# Patient Record
Sex: Female | Born: 1945 | Race: Black or African American | Hispanic: No | Smoking: Never smoker
Health system: Southern US, Community
[De-identification: ages and names within clinical notes are randomized; demographics above are authoritative.]

## PROBLEM LIST (undated history)

## (undated) DIAGNOSIS — G5600 Carpal tunnel syndrome, unspecified upper limb: Secondary | ICD-10-CM

## (undated) DIAGNOSIS — M1990 Unspecified osteoarthritis, unspecified site: Secondary | ICD-10-CM

## (undated) DIAGNOSIS — D649 Anemia, unspecified: Secondary | ICD-10-CM

## (undated) DIAGNOSIS — E119 Type 2 diabetes mellitus without complications: Secondary | ICD-10-CM

## (undated) DIAGNOSIS — E785 Hyperlipidemia, unspecified: Secondary | ICD-10-CM

## (undated) DIAGNOSIS — E11359 Type 2 diabetes mellitus with proliferative diabetic retinopathy without macular edema: Secondary | ICD-10-CM

## (undated) DIAGNOSIS — F418 Other specified anxiety disorders: Secondary | ICD-10-CM

## (undated) DIAGNOSIS — I2510 Atherosclerotic heart disease of native coronary artery without angina pectoris: Secondary | ICD-10-CM

## (undated) DIAGNOSIS — I10 Essential (primary) hypertension: Secondary | ICD-10-CM

## (undated) DIAGNOSIS — I5032 Chronic diastolic (congestive) heart failure: Secondary | ICD-10-CM

## (undated) HISTORY — PX: ABDOMINAL HYSTERECTOMY: SHX81

## (undated) HISTORY — DX: Anemia, unspecified: D64.9

## (undated) HISTORY — DX: Chronic diastolic (congestive) heart failure: I50.32

## (undated) HISTORY — DX: Unspecified osteoarthritis, unspecified site: M19.90

## (undated) HISTORY — DX: Essential (primary) hypertension: I10

## (undated) HISTORY — DX: Type 2 diabetes mellitus with proliferative diabetic retinopathy without macular edema: E11.359

## (undated) HISTORY — DX: Atherosclerotic heart disease of native coronary artery without angina pectoris: I25.10

## (undated) HISTORY — DX: Carpal tunnel syndrome, unspecified upper limb: G56.00

## (undated) HISTORY — DX: Other specified anxiety disorders: F41.8

## (undated) HISTORY — DX: Type 2 diabetes mellitus without complications: E11.9

## (undated) HISTORY — DX: Hyperlipidemia, unspecified: E78.5

---

## 1997-10-17 ENCOUNTER — Other Ambulatory Visit: Admission: RE | Admit: 1997-10-17 | Discharge: 1997-10-17 | Payer: Self-pay | Admitting: Internal Medicine

## 1997-11-02 ENCOUNTER — Other Ambulatory Visit: Admission: RE | Admit: 1997-11-02 | Discharge: 1997-11-02 | Payer: Self-pay | Admitting: Internal Medicine

## 1997-12-21 ENCOUNTER — Emergency Department (HOSPITAL_COMMUNITY): Admission: EM | Admit: 1997-12-21 | Discharge: 1997-12-21 | Payer: Self-pay | Admitting: Emergency Medicine

## 1998-08-18 ENCOUNTER — Emergency Department (HOSPITAL_COMMUNITY): Admission: EM | Admit: 1998-08-18 | Discharge: 1998-08-19 | Payer: Self-pay | Admitting: Emergency Medicine

## 2001-03-19 ENCOUNTER — Other Ambulatory Visit: Admission: RE | Admit: 2001-03-19 | Discharge: 2001-03-19 | Payer: Self-pay | Admitting: Internal Medicine

## 2001-03-20 ENCOUNTER — Encounter: Payer: Self-pay | Admitting: Internal Medicine

## 2001-03-20 ENCOUNTER — Encounter: Admission: RE | Admit: 2001-03-20 | Discharge: 2001-03-20 | Payer: Self-pay | Admitting: Internal Medicine

## 2001-05-29 ENCOUNTER — Encounter: Payer: Self-pay | Admitting: Internal Medicine

## 2001-05-29 ENCOUNTER — Encounter: Admission: RE | Admit: 2001-05-29 | Discharge: 2001-05-29 | Payer: Self-pay | Admitting: Internal Medicine

## 2003-07-05 ENCOUNTER — Ambulatory Visit (HOSPITAL_COMMUNITY): Admission: RE | Admit: 2003-07-05 | Discharge: 2003-07-05 | Payer: Self-pay | Admitting: Orthopaedic Surgery

## 2003-07-05 ENCOUNTER — Ambulatory Visit (HOSPITAL_BASED_OUTPATIENT_CLINIC_OR_DEPARTMENT_OTHER): Admission: RE | Admit: 2003-07-05 | Discharge: 2003-07-05 | Payer: Self-pay | Admitting: Orthopaedic Surgery

## 2005-07-14 ENCOUNTER — Emergency Department (HOSPITAL_COMMUNITY): Admission: EM | Admit: 2005-07-14 | Discharge: 2005-07-14 | Payer: Self-pay | Admitting: Emergency Medicine

## 2005-08-08 ENCOUNTER — Ambulatory Visit: Payer: Self-pay | Admitting: Family Medicine

## 2005-10-03 ENCOUNTER — Ambulatory Visit: Payer: Self-pay | Admitting: Family Medicine

## 2005-10-28 ENCOUNTER — Encounter: Admission: RE | Admit: 2005-10-28 | Discharge: 2005-11-21 | Payer: Self-pay | Admitting: Family Medicine

## 2005-11-27 ENCOUNTER — Ambulatory Visit: Payer: Self-pay | Admitting: Family Medicine

## 2006-08-31 ENCOUNTER — Emergency Department (HOSPITAL_COMMUNITY): Admission: EM | Admit: 2006-08-31 | Discharge: 2006-08-31 | Payer: Self-pay | Admitting: Family Medicine

## 2006-09-26 ENCOUNTER — Emergency Department (HOSPITAL_COMMUNITY): Admission: EM | Admit: 2006-09-26 | Discharge: 2006-09-26 | Payer: Self-pay | Admitting: Family Medicine

## 2008-04-25 ENCOUNTER — Emergency Department (HOSPITAL_COMMUNITY): Admission: EM | Admit: 2008-04-25 | Discharge: 2008-04-25 | Payer: Self-pay | Admitting: Family Medicine

## 2008-10-26 DIAGNOSIS — E785 Hyperlipidemia, unspecified: Secondary | ICD-10-CM

## 2008-10-26 DIAGNOSIS — I119 Hypertensive heart disease without heart failure: Secondary | ICD-10-CM | POA: Insufficient documentation

## 2008-10-26 DIAGNOSIS — R002 Palpitations: Secondary | ICD-10-CM | POA: Insufficient documentation

## 2008-10-28 ENCOUNTER — Ambulatory Visit: Payer: Self-pay | Admitting: Cardiology

## 2008-10-28 DIAGNOSIS — R0602 Shortness of breath: Secondary | ICD-10-CM

## 2008-10-28 DIAGNOSIS — Z8679 Personal history of other diseases of the circulatory system: Secondary | ICD-10-CM | POA: Insufficient documentation

## 2008-11-07 ENCOUNTER — Telehealth: Payer: Self-pay | Admitting: Cardiology

## 2008-11-08 ENCOUNTER — Telehealth (INDEPENDENT_AMBULATORY_CARE_PROVIDER_SITE_OTHER): Payer: Self-pay | Admitting: *Deleted

## 2008-11-09 ENCOUNTER — Ambulatory Visit: Payer: Self-pay

## 2008-11-09 ENCOUNTER — Ambulatory Visit: Payer: Self-pay | Admitting: Cardiology

## 2008-11-09 ENCOUNTER — Encounter: Payer: Self-pay | Admitting: Cardiology

## 2008-11-09 DIAGNOSIS — R0989 Other specified symptoms and signs involving the circulatory and respiratory systems: Secondary | ICD-10-CM

## 2008-11-09 DIAGNOSIS — R0609 Other forms of dyspnea: Secondary | ICD-10-CM | POA: Insufficient documentation

## 2008-11-10 ENCOUNTER — Ambulatory Visit: Payer: Self-pay | Admitting: Cardiology

## 2008-11-10 DIAGNOSIS — I5032 Chronic diastolic (congestive) heart failure: Secondary | ICD-10-CM | POA: Insufficient documentation

## 2008-11-10 LAB — CONVERTED CEMR LAB
BUN: 20 mg/dL (ref 6–23)
CO2: 32 meq/L (ref 19–32)
GFR calc non Af Amer: 93.27 mL/min (ref >60)
Glucose, Bld: 289 mg/dL — ABNORMAL HIGH (ref 70–99)
HDL: 40.5 mg/dL (ref >39.00)
Sodium: 143 meq/L (ref 135–145)
Triglycerides: 84 mg/dL (ref 0.0–149.0)

## 2008-11-21 ENCOUNTER — Ambulatory Visit: Payer: Self-pay | Admitting: Cardiology

## 2008-11-22 ENCOUNTER — Telehealth: Payer: Self-pay | Admitting: Cardiology

## 2008-11-23 ENCOUNTER — Encounter (INDEPENDENT_AMBULATORY_CARE_PROVIDER_SITE_OTHER): Payer: Self-pay | Admitting: *Deleted

## 2008-11-23 ENCOUNTER — Ambulatory Visit: Payer: Self-pay | Admitting: Cardiology

## 2008-11-23 DIAGNOSIS — R9430 Abnormal result of cardiovascular function study, unspecified: Secondary | ICD-10-CM | POA: Insufficient documentation

## 2008-11-23 LAB — CONVERTED CEMR LAB
Calcium: 9.1 mg/dL (ref 8.4–10.5)
GFR calc non Af Amer: 81.4 mL/min (ref >60)
Glucose, Bld: 308 mg/dL — ABNORMAL HIGH (ref 70–99)

## 2008-11-24 ENCOUNTER — Ambulatory Visit (HOSPITAL_COMMUNITY): Admission: RE | Admit: 2008-11-24 | Discharge: 2008-11-24 | Payer: Self-pay | Admitting: Cardiology

## 2008-11-24 ENCOUNTER — Ambulatory Visit: Payer: Self-pay | Admitting: Cardiology

## 2008-12-15 ENCOUNTER — Ambulatory Visit: Payer: Self-pay | Admitting: Cardiology

## 2008-12-15 DIAGNOSIS — I2510 Atherosclerotic heart disease of native coronary artery without angina pectoris: Secondary | ICD-10-CM

## 2009-04-19 ENCOUNTER — Encounter (INDEPENDENT_AMBULATORY_CARE_PROVIDER_SITE_OTHER): Payer: Self-pay | Admitting: *Deleted

## 2009-04-25 ENCOUNTER — Telehealth: Payer: Self-pay | Admitting: Cardiology

## 2009-05-31 ENCOUNTER — Ambulatory Visit: Payer: Self-pay | Admitting: Cardiology

## 2009-06-06 LAB — CONVERTED CEMR LAB
ALT: 22 units/L (ref 0–35)
AST: 18 units/L (ref 0–37)
Albumin: 3.5 g/dL (ref 3.5–5.2)
Alkaline Phosphatase: 90 units/L (ref 39–117)
Cholesterol: 154 mg/dL (ref 0–200)
Total Protein: 7.3 g/dL (ref 6.0–8.3)

## 2009-06-16 ENCOUNTER — Telehealth: Payer: Self-pay | Admitting: Cardiology

## 2009-06-27 ENCOUNTER — Telehealth: Payer: Self-pay | Admitting: Cardiology

## 2009-06-27 ENCOUNTER — Ambulatory Visit: Payer: Self-pay | Admitting: Cardiology

## 2009-06-27 LAB — CONVERTED CEMR LAB
BUN: 23 mg/dL (ref 6–23)
CO2: 30 meq/L (ref 19–32)
Chloride: 97 meq/L (ref 96–112)
Creatinine, Ser: 1.1 mg/dL (ref 0.4–1.2)
Eosinophils Absolute: 0.1 10*3/uL (ref 0.0–0.7)
Lymphocytes Relative: 23 % (ref 12.0–46.0)
Lymphs Abs: 2.2 10*3/uL (ref 0.7–4.0)
MCHC: 32.3 g/dL (ref 30.0–36.0)
MCV: 85 fL (ref 78.0–100.0)
Monocytes Relative: 6.6 % (ref 3.0–12.0)
Neutro Abs: 6.7 10*3/uL (ref 1.4–7.7)
Potassium: 4.1 meq/L (ref 3.5–5.1)
RDW: 12.3 % (ref 11.5–14.6)

## 2009-07-19 ENCOUNTER — Encounter: Admission: RE | Admit: 2009-07-19 | Discharge: 2009-07-19 | Payer: Self-pay | Admitting: Family Medicine

## 2009-08-14 ENCOUNTER — Ambulatory Visit: Payer: Self-pay | Admitting: Cardiology

## 2009-08-14 DIAGNOSIS — E118 Type 2 diabetes mellitus with unspecified complications: Secondary | ICD-10-CM | POA: Insufficient documentation

## 2009-08-28 ENCOUNTER — Telehealth: Payer: Self-pay | Admitting: Cardiology

## 2009-11-21 ENCOUNTER — Encounter: Payer: Self-pay | Admitting: Family Medicine

## 2009-11-28 ENCOUNTER — Telehealth: Payer: Self-pay | Admitting: *Deleted

## 2009-11-28 ENCOUNTER — Ambulatory Visit: Payer: Self-pay | Admitting: Family Medicine

## 2009-11-28 DIAGNOSIS — M545 Low back pain: Secondary | ICD-10-CM

## 2009-11-28 LAB — CONVERTED CEMR LAB: Hgb A1c MFr Bld: 11.4 %

## 2009-12-11 ENCOUNTER — Encounter: Payer: Self-pay | Admitting: Family Medicine

## 2009-12-14 ENCOUNTER — Encounter: Admission: RE | Admit: 2009-12-14 | Discharge: 2009-12-14 | Payer: Self-pay | Admitting: Sports Medicine

## 2009-12-14 ENCOUNTER — Ambulatory Visit: Payer: Self-pay | Admitting: Family Medicine

## 2009-12-19 ENCOUNTER — Telehealth (INDEPENDENT_AMBULATORY_CARE_PROVIDER_SITE_OTHER): Payer: Self-pay | Admitting: *Deleted

## 2010-01-04 ENCOUNTER — Telehealth: Payer: Self-pay | Admitting: Cardiology

## 2010-01-15 ENCOUNTER — Encounter: Payer: Self-pay | Admitting: Family Medicine

## 2010-01-23 ENCOUNTER — Telehealth: Payer: Self-pay | Admitting: *Deleted

## 2010-01-23 DIAGNOSIS — M25569 Pain in unspecified knee: Secondary | ICD-10-CM

## 2010-01-29 LAB — HM COLONOSCOPY: HM Colonoscopy: NORMAL

## 2010-02-06 ENCOUNTER — Telehealth: Payer: Self-pay | Admitting: Cardiology

## 2010-02-23 ENCOUNTER — Ambulatory Visit: Payer: Self-pay | Admitting: Endocrinology

## 2010-02-23 DIAGNOSIS — E113599 Type 2 diabetes mellitus with proliferative diabetic retinopathy without macular edema, unspecified eye: Secondary | ICD-10-CM

## 2010-02-23 DIAGNOSIS — D649 Anemia, unspecified: Secondary | ICD-10-CM | POA: Insufficient documentation

## 2010-02-23 LAB — CONVERTED CEMR LAB
Albumin: 3.4 g/dL — ABNORMAL LOW (ref 3.5–5.2)
Basophils Absolute: 0 10*3/uL (ref 0.0–0.1)
Basophils Relative: 0.3 % (ref 0.0–3.0)
CO2: 29 meq/L (ref 19–32)
Calcium: 9.5 mg/dL (ref 8.4–10.5)
Chloride: 103 meq/L (ref 96–112)
Cholesterol: 208 mg/dL — ABNORMAL HIGH (ref 0–200)
Creatinine, Ser: 1.1 mg/dL (ref 0.4–1.2)
Direct LDL: 130.7 mg/dL
Eosinophils Relative: 1.8 % (ref 0.0–5.0)
GFR calc non Af Amer: 63.65 mL/min (ref >60)
Glucose, Bld: 121 mg/dL — ABNORMAL HIGH (ref 70–99)
HCT: 30.9 % — ABNORMAL LOW (ref 36.0–46.0)
Hemoglobin: 10.2 g/dL — ABNORMAL LOW (ref 12.0–15.0)
Iron: 47 ug/dL (ref 42–145)
Ketones, ur: NEGATIVE mg/dL
Lymphs Abs: 2.1 10*3/uL (ref 0.7–4.0)
Microalb Creat Ratio: 3.8 mg/g (ref 0.0–30.0)
Monocytes Relative: 7 % (ref 3.0–12.0)
Neutro Abs: 5.7 10*3/uL (ref 1.4–7.7)
Neutrophils Relative %: 66.3 % (ref 43.0–77.0)
Nitrite: NEGATIVE
Platelets: 267 10*3/uL (ref 150.0–400.0)
Saturation Ratios: 15.4 % — ABNORMAL LOW (ref 20.0–50.0)
TSH: 0.82 microintl units/mL (ref 0.35–5.50)
Total CHOL/HDL Ratio: 4
Total Protein, Urine: NEGATIVE mg/dL
Total Protein: 6.4 g/dL (ref 6.0–8.3)
Transferrin: 217.4 mg/dL (ref 212.0–360.0)
Triglycerides: 93 mg/dL (ref 0.0–149.0)
Urine Glucose: NEGATIVE mg/dL
Urobilinogen, UA: 0.2 (ref 0.0–1.0)
VLDL: 18.6 mg/dL (ref 0.0–40.0)
pH: 5 (ref 5.0–8.0)

## 2010-03-12 ENCOUNTER — Ambulatory Visit: Payer: Self-pay | Admitting: Endocrinology

## 2010-05-14 ENCOUNTER — Ambulatory Visit: Payer: Self-pay | Admitting: Internal Medicine

## 2010-05-21 IMAGING — CR DG ABDOMEN 1V
2 series · 2 of 2 positions shown · non-contrast
Comparison: None.

CLINICAL DATA: 1-month history of right upper quadrant abdominal
pain radiating to the back.

ABDOMEN - 1 VIEW 07/19/2009:

[view not recorded (1 of 2)]
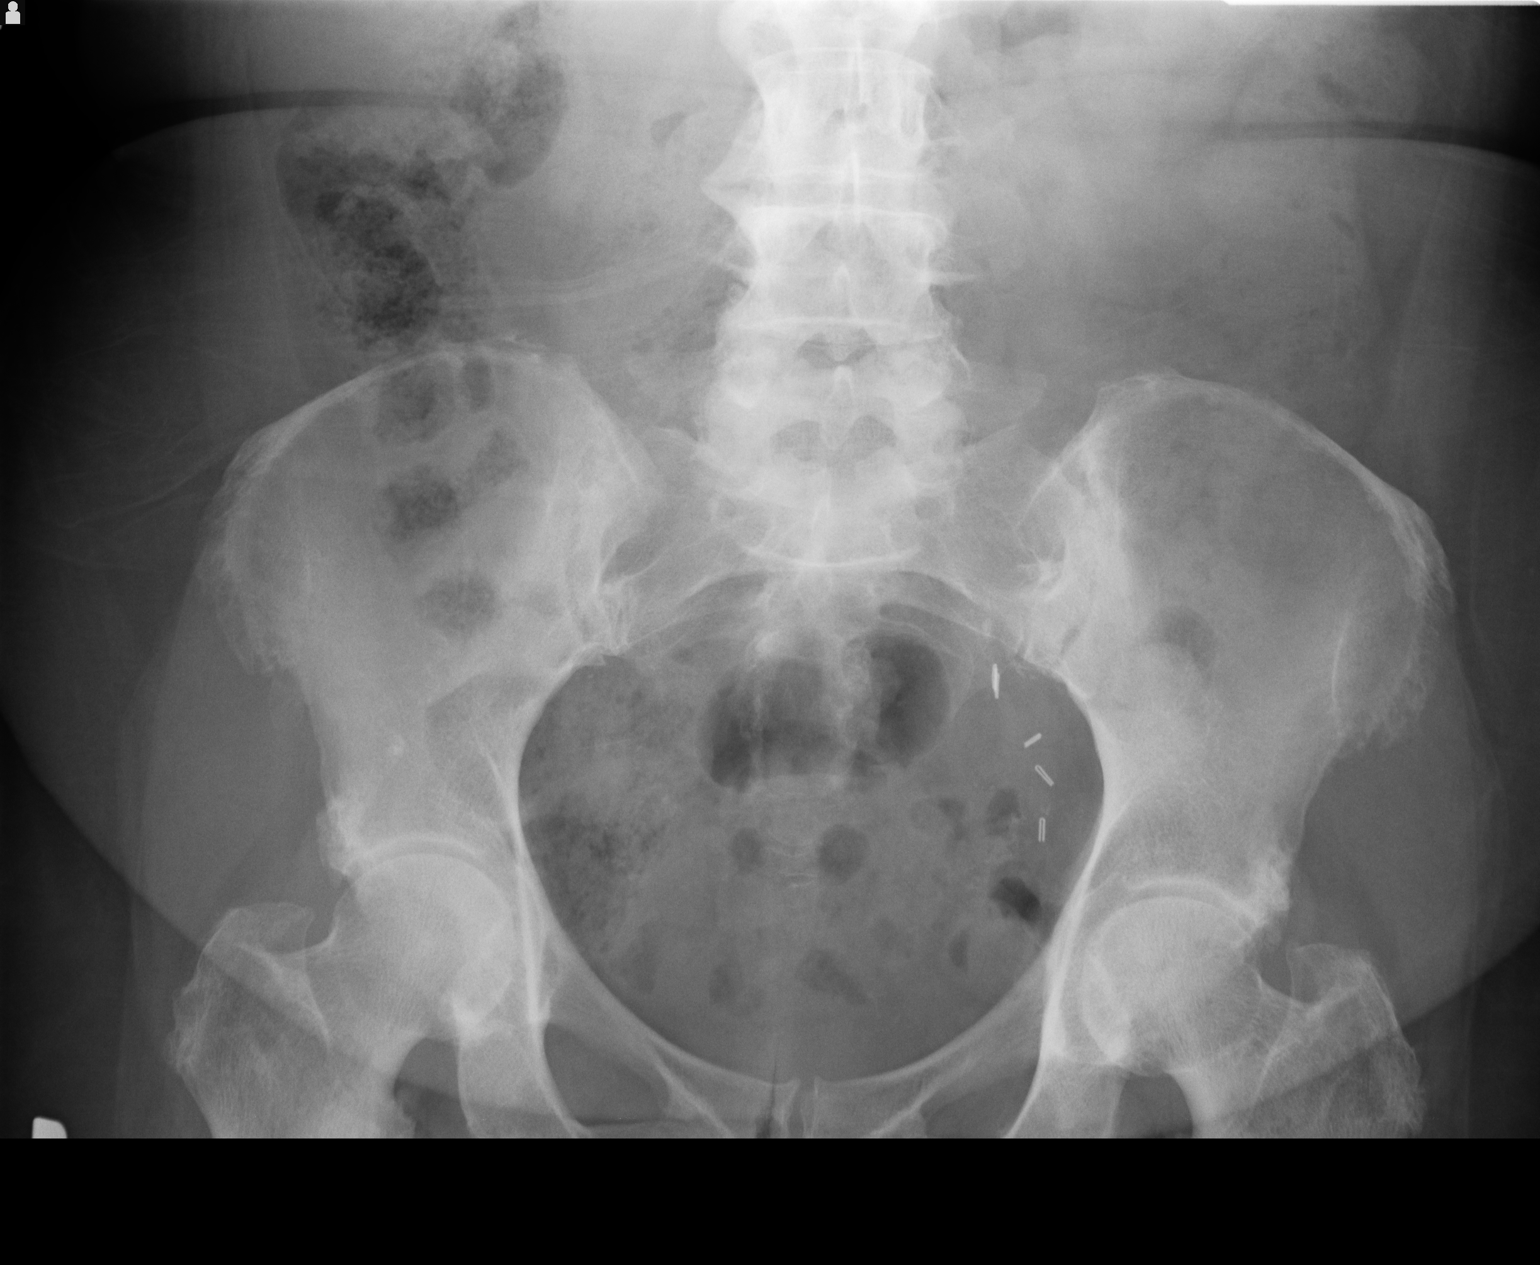

[view not recorded (2 of 2)]
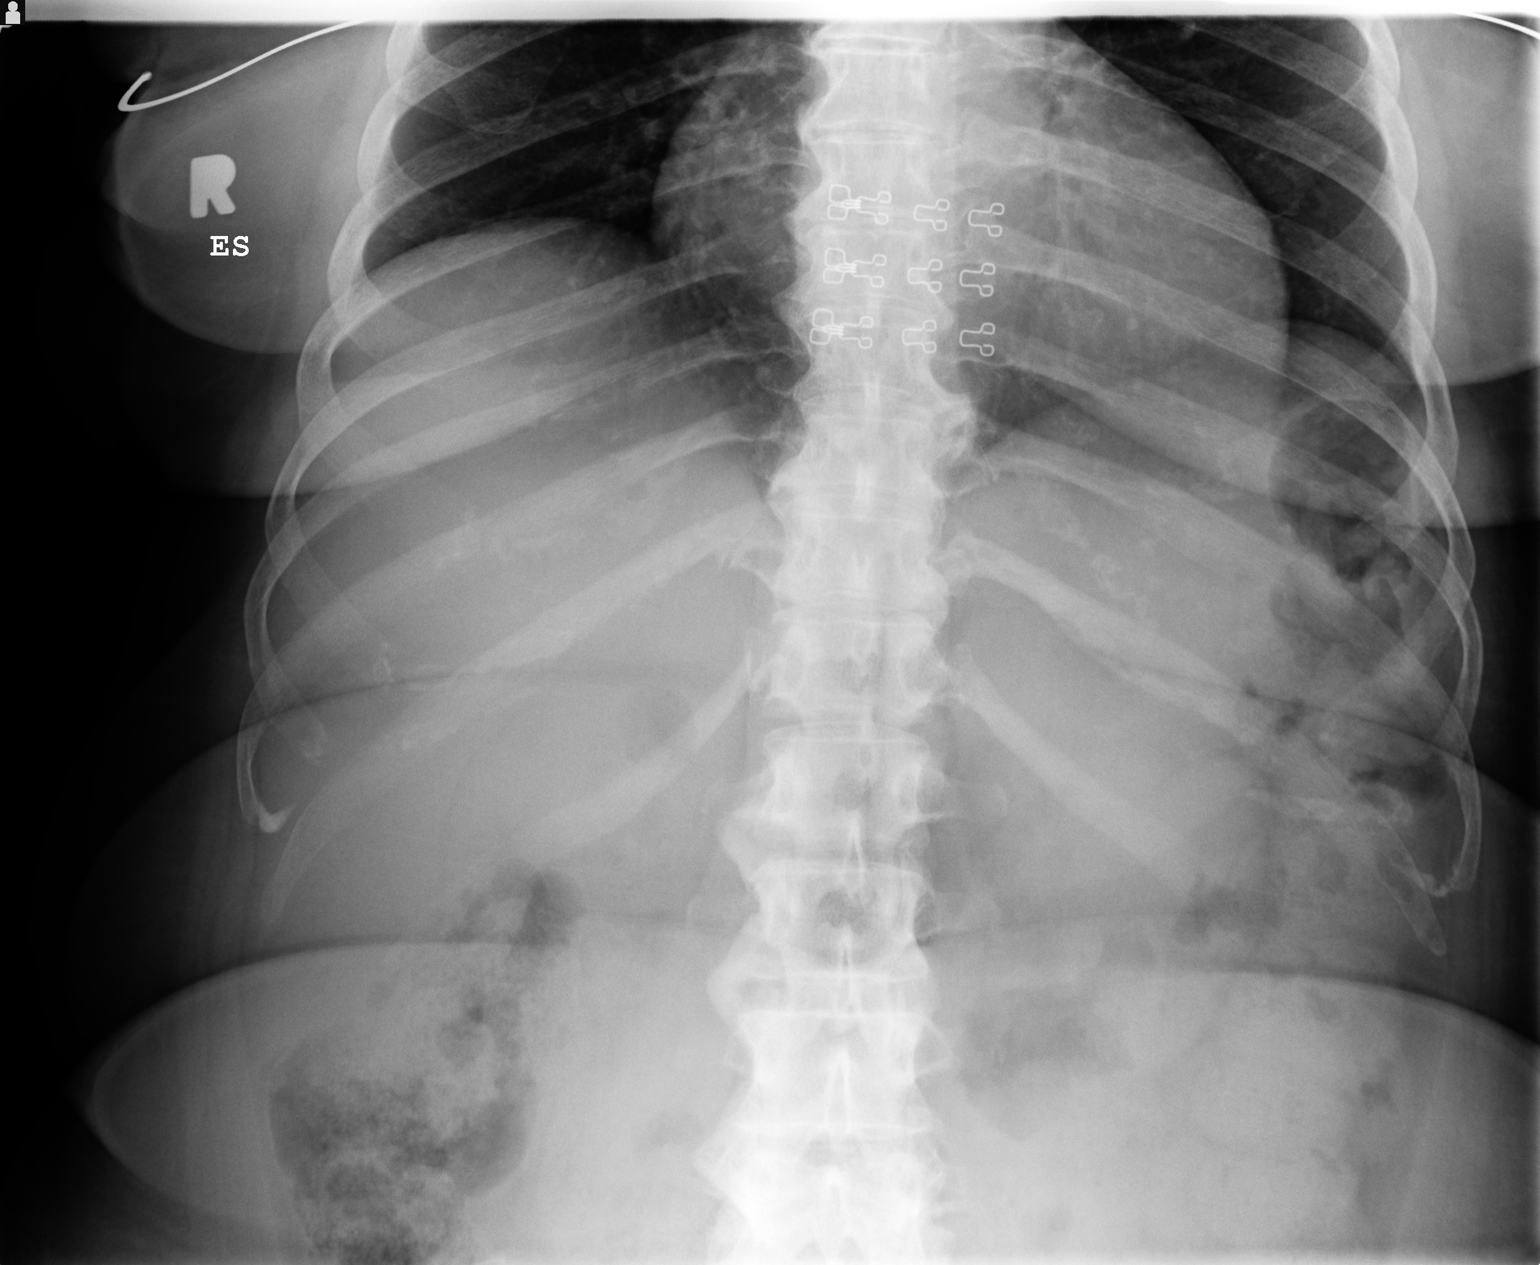

[2 of 2 positions shown; findings below may reference images not displayed]

FINDINGS: Bowel gas pattern unremarkable without evidence of
obstruction or significant ileus.  Moderate stool throughout the
colon.  No visible opaque urinary tract calculi.  Surgical clips in
the left side of the pelvis.  Degenerative changes throughout the
lumbar spine.
IMPRESSION: No acute abdominal abnormality.

## 2010-06-04 ENCOUNTER — Ambulatory Visit: Payer: Self-pay | Admitting: Endocrinology

## 2010-06-04 DIAGNOSIS — G5600 Carpal tunnel syndrome, unspecified upper limb: Secondary | ICD-10-CM | POA: Insufficient documentation

## 2010-06-04 LAB — CONVERTED CEMR LAB
Basophils Absolute: 0 10*3/uL
Basophils Relative: 0.3 %
Cholesterol: 178 mg/dL
Eosinophils Absolute: 0.1 10*3/uL
Eosinophils Relative: 1.6 %
HCT: 30.7 % — ABNORMAL LOW
HDL: 63.4 mg/dL
Hemoglobin: 10.3 g/dL — ABNORMAL LOW
Hgb A1c MFr Bld: 9.8 % — ABNORMAL HIGH
Iron: 53 ug/dL
LDL Cholesterol: 92 mg/dL
Lymphocytes Relative: 15.5 %
Lymphs Abs: 1.4 10*3/uL
MCHC: 33.5 g/dL
MCV: 83.9 fL
Monocytes Absolute: 0.6 10*3/uL
Monocytes Relative: 6.1 %
Neutro Abs: 7 10*3/uL
Neutrophils Relative %: 76.5 %
Platelets: 243 10*3/uL
RBC: 3.65 M/uL — ABNORMAL LOW
RDW: 13.2 %
Saturation Ratios: 15.9 % — ABNORMAL LOW
Total CHOL/HDL Ratio: 3
Transferrin: 237.4 mg/dL
Triglycerides: 112 mg/dL
VLDL: 22.4 mg/dL
Vitamin B-12: 717 pg/mL
WBC: 9.1 10*3/uL

## 2010-07-22 ENCOUNTER — Encounter: Payer: Self-pay | Admitting: Family Medicine

## 2010-07-23 ENCOUNTER — Encounter: Payer: Self-pay | Admitting: Family Medicine

## 2010-07-29 LAB — CONVERTED CEMR LAB
Basophils Absolute: 0 10*3/uL (ref 0.0–0.1)
Basophils Relative: 0.4 % (ref 0.0–3.0)
CO2: 30 meq/L (ref 19–32)
Calcium: 9.6 mg/dL (ref 8.4–10.5)
GFR calc non Af Amer: 81.42 mL/min (ref >60)
HCT: 35.9 % — ABNORMAL LOW (ref 36.0–46.0)
Lymphocytes Relative: 20 % (ref 12.0–46.0)
Lymphs Abs: 1.8 10*3/uL (ref 0.7–4.0)
Monocytes Relative: 5.7 % (ref 3.0–12.0)
Neutro Abs: 6.4 10*3/uL (ref 1.4–7.7)
Neutrophils Relative %: 72.6 % (ref 43.0–77.0)
Platelets: 288 10*3/uL (ref 150.0–400.0)
Pro B Natriuretic peptide (BNP): 104 pg/mL — ABNORMAL HIGH (ref 0.0–100.0)
WBC: 8.8 10*3/uL (ref 4.5–10.5)

## 2010-07-31 ENCOUNTER — Telehealth: Payer: Self-pay | Admitting: Endocrinology

## 2010-08-02 NOTE — Assessment & Plan Note (Signed)
Summary: NP,tcb   Vital Signs:  Patient profile:   65 year old female Height:      66 inches Weight:      185 pounds BMI:     29.97 BSA:     1.94 Temp:     98.6 degrees F Pulse rate:   96 / minute BP sitting:   170 / 84  Vitals Entered By: Jone Baseman CMA (Nov 28, 2009 9:24 AM) CC: new patient Is Patient Diabetic? No Pain Assessment Patient in pain? yes     Location: lower back Intensity: 6   Primary Care Provider:  Dr. Pecola Leisure  CC:  new patient.  History of Present Illness: New patient visit  Health concerns 1. HTN:  She usually takes her medicines as prescribed.   She doesn't check her blood pressure at home regularly      ROS: denies chest pain, shortness of breath  2. Colonscopy:  wants to get a screening colonscopy      ROS: denies rectal bleeding, weight loss, night sweats  3. Low back pain:  Has had this since 2006 since she fell at work.  She had an x-ray and nerve conduction studies done then and was told that she headaches DDD and some neuropathy.  Takes neurontin and Oxycodone.  Thinks that her back pain is getting worse.    ROS: endorses some left leg weakness  4. DMII:  Take Gly/Metformin daily.  She doesn't check her blood sugars regularly.  Habits & Providers  Alcohol-Tobacco-Diet     Tobacco Status: never  Allergies: No Known Drug Allergies  Past History:  Past Medical History: Reviewed history from 12/15/2008 and no changes required. 1.  DM2 2.  HTN 3.  Obesity 4.  Hyperlipidemia 5.  Diabetic peripheral neuropathy 6.  CAD:  Adenosine myoview (5/10): EF 38%, mild anterior ischemia.  LHC/RHC (5/10):  mean RA 3, mean PCWP 5, EF 65%.   Diffuse CAD in small branch vessels, consistent with diabetes.  No significant stenoses in the major vessels.   7.  CHF:  Probably diastolic.  EF 65% on echo (myoview read as 38% but may be inaccurate).  Echo (5/10) with mod-severe LVH, EF 65%, diastolic dysfunction with evidence for increased LV filling  pressure, no significant AS, mild LAE.   Family History: Reviewed history from 10/28/2008 and no changes required. Mother died MI at 26 Sister died ESRD  ~ 60 Sister died from Ovarian Cancer  ~ 47  Social History: Reviewed history from 10/28/2008 and no changes required. Lives alone in [place].  Has children.  Nonsmoker, no ETOH.  Unemployed. Smoking Status:  never  Physical Exam  General:  Well developed, well nourished, in no acute distress. Head:  normocephalic and atraumatic Eyes:  vision grossly intact.   Mouth:  Teeth, gums and palate normal. Oral mucosa normal. Neck:  Neck supple, JVP not elevated. No masses, thyromegaly or abnormal cervical nodes. Lungs:  Clear bilaterally to auscultation and percussion. Heart:  Non-displaced PMI, chest non-tender; regular rate and rhythm, S1, S2, +soft S4, 1/6 systolic crescendo-decrescendo murmur RUSB with normal S2.  Abdomen:  Bowel sounds positive; abdomen soft and non-tender without masses, organomegaly, or hernias noted. No hepatosplenomegaly. Extremities:  No clubbing or cyanosis. Neurologic:  Alert and oriented x 3. Skin:  Intact without lesions or rashes. Psych:  Normal affect.   Impression & Recommendations:  Problem # 1:  HYPERTENSION (ICD-401.9) Assessment Deteriorated Not at goal.  Will increase Lisinopril to 40 mg daily Her updated  medication list for this problem includes:    Caduet 10-80 Mg Tabs (Amlodipine-atorvastatin) .Marland Kitchen... Take one tablet every other day    Lisinopril 40 Mg Tabs (Lisinopril) .Marland Kitchen... 1 daily    Chlorthalidone 25 Mg Tabs (Chlorthalidone) ..... One tablet daily    Toprol Xl 50 Mg Xr24h-tab (Metoprolol succinate)  Problem # 2:  BACK PAIN, LUMBAR (ICD-724.2) Assessment: Unchanged Chronic.  Will get lumbar x-ray to look for progression of DDD. Her updated medication list for this problem includes:    Aspirin 81 Mg Tbec (Aspirin) .Marland Kitchen... Take one tablet by mouth daily  Orders: Diagnostic  X-Ray/Fluoroscopy (Diagnostic X-Ray/Flu)  Problem # 3:  DM (ICD-250.00) Assessment: Unchanged Will get A1C to assess long term blood sugar control Her updated medication list for this problem includes:    Glyburide-metformin 2.5-500 Mg Tabs (Glyburide-metformin) .Marland Kitchen... 1 tab by mouth two times a day    Aspirin 81 Mg Tbec (Aspirin) .Marland Kitchen... Take one tablet by mouth daily    Lisinopril 40 Mg Tabs (Lisinopril) .Marland Kitchen... 1 daily  Orders: A1C-[facility] (19147)  Complete Medication List: 1)  Gabapentin 100 Mg Caps (Gabapentin) .Marland Kitchen.. 1 tab once daily 2)  Glyburide-metformin 2.5-500 Mg Tabs (Glyburide-metformin) .Marland Kitchen.. 1 tab by mouth two times a day 3)  Caduet 10-80 Mg Tabs (Amlodipine-atorvastatin) .... Take one tablet every other day 4)  Aspirin 81 Mg Tbec (Aspirin) .... Take one tablet by mouth daily 5)  Lisinopril 40 Mg Tabs (Lisinopril) .Marland Kitchen.. 1 daily 6)  Chlorthalidone 25 Mg Tabs (Chlorthalidone) .... One tablet daily 7)  Toprol Xl 50 Mg Xr24h-tab (Metoprolol succinate)  Other Orders: Colonoscopy (Colon)  Patient Instructions: 1)  It was nice to meet you today 2)  I am going to make a couple changes 3)  I would like for you to start taking 40mg  of Lisinopril each day 4)  Please keep track of your blood pressure over the next couple of weeks 5)  I am also going to send you for an x-ray of your back 6)  I have put in the order for the colonoscopy but you will need to call the number provided and schedule it yourself 7)  Please schedule a follow up appointment with me in 4-6 weeks  Prevention & Chronic Care Immunizations   Influenza vaccine: Not documented    Tetanus booster: Not documented    Pneumococcal vaccine: Not documented    H. zoster vaccine: Not documented  Colorectal Screening   Hemoccult: Not documented    Colonoscopy: Not documented   Colonoscopy action/deferral: GI Referral  (11/28/2009)  Other Screening   Pap smear: Not documented    Mammogram: Not documented    DXA bone  density scan: Not documented   Smoking status: never  (11/28/2009)  Diabetes Mellitus   HgbA1C: 11.4  (11/28/2009)   HgbA1C action/deferral: Ordered  (11/28/2009)    Eye exam: Not documented    Foot exam: Not documented   High risk foot: Not documented   Foot care education: Not documented    Urine microalbumin/creatinine ratio: Not documented    Diabetes flowsheet reviewed?: Yes   Progress toward A1C goal: Unchanged  Lipids   Total Cholesterol: 154  (05/31/2009)   LDL: 88  (05/31/2009)   LDL Direct: Not documented   HDL: 50.20  (05/31/2009)   Triglycerides: 78.0  (05/31/2009)    SGOT (AST): 18  (05/31/2009)   SGPT (ALT): 22  (05/31/2009)   Alkaline phosphatase: 90  (05/31/2009)   Total bilirubin: 0.5  (05/31/2009)    Lipid  flowsheet reviewed?: Yes   Progress toward LDL goal: Unchanged  Hypertension   Last Blood Pressure: 170 / 84  (11/28/2009)   Serum creatinine: 1.1  (06/27/2009)   Serum potassium 4.1  (06/27/2009)    Hypertension flowsheet reviewed?: Yes   Progress toward BP goal: Unchanged  Self-Management Support :   Personal Goals (by the next clinic visit) :     Personal A1C goal: 8  (11/28/2009)     Personal blood pressure goal: 140/90  (11/28/2009)     Personal LDL goal: 130  (11/28/2009)    Diabetes self-management support: Not documented    Hypertension self-management support: Not documented    Lipid self-management support: Not documented    Nursing Instructions: Screening colonoscopy ordered HgbA1C today (see order)    Laboratory Results   Blood Tests   Date/Time Received: Nov 28, 2009 10:13 AM  Date/Time Reported: Nov 28, 2009 10:28 AM   HGBA1C: 11.4%   (Normal Range: Non-Diabetic - 3-6%   Control Diabetic - 6-8%)  Comments: ...............test performed by......Marland KitchenBonnie A. [place], MLS (ASCP)cm

## 2010-08-02 NOTE — Assessment & Plan Note (Signed)
Summary: 2 month rov/sl   Primary Provider:  Dr. Pecola Leisure   History of Present Illness: 65 yo with history of CAD by cath (small branch vessel involvement), HTN, diabetes, diastolic CHF presents for followup.  She has been doing well with no chest pain or significant exertional shortness of breath.  She is doing some aerobics with videos at home.  She is able to climb a flight of steps without problems.  She plans to start going back to the [facility] again.  She has had some trouble with balance but is not lightheaded.  She has been told in the past that she has neuropathy.   BP is high today but she has not taken any of her medications yet.   Labs (12/1): LDL 88, HDL 50, TG 78, creatinine 1.1  Current Medications (verified): 1)  Gabapentin 100 Mg Caps (Gabapentin) .Marland Kitchen.. 1 Tab Once Daily 2)  Glyburide-Metformin 2.5-500 Mg Tabs (Glyburide-Metformin) .Marland Kitchen.. 1 Tab By Mouth Two Times A Day 3)  Coreg 25 Mg Tabs (Carvedilol) .... One Tablet Twice A Day 4)  Caduet 10-80 Mg Tabs (Amlodipine-Atorvastatin) .... Take One Tablet Every Other Day 5)  Aspirin 81 Mg Tbec (Aspirin) .... Take One Tablet By Mouth Daily 6)  Lisinopril 40 Mg Tabs (Lisinopril) .Marland Kitchen.. 1 Daily 7)  Chlorthalidone 25 Mg Tabs (Chlorthalidone) .... One Tablet Daily  Allergies (verified): No Known Drug Allergies  Past History:  Past Medical History: Last updated: 12/15/2008 1.  DM2 2.  HTN 3.  Obesity 4.  Hyperlipidemia 5.  Diabetic peripheral neuropathy 6.  CAD:  Adenosine myoview (5/10): EF 38%, mild anterior ischemia.  LHC/RHC (5/10):  mean RA 3, mean PCWP 5, EF 65%.   Diffuse CAD in small branch vessels, consistent with diabetes.  No significant stenoses in the major vessels.   7.  CHF:  Probably diastolic.  EF 65% on echo (myoview read as 38% but may be inaccurate).  Echo (5/10) with mod-severe LVH, EF 65%, diastolic dysfunction with evidence for increased LV filling pressure, no significant AS, mild LAE.   Family History: Last  updated: 10/28/2008 Mother with MI at 82  Social History: Last updated: 10/28/2008 Lives alone in [place].  Has children.  Nonsmoker, no ETOH.  Unemployed.   Family History: Reviewed history from 10/28/2008 and no changes required. Mother with MI at 15  Social History: Reviewed history from 10/28/2008 and no changes required. Lives alone in [place].  Has children.  Nonsmoker, no ETOH.  Unemployed.   Review of Systems       All systems reviewed and negative except as per HPI.   Vital Signs:  Patient profile:   65 year old female Height:      66 inches Weight:      188 pounds BMI:     30.45 Pulse rate:   72 / minute Pulse rhythm:   regular BP sitting:   176 / 84  (left arm) Cuff size:   large  Vitals Entered By: Judithe Modest CMA (August 14, 2009 8:36 AM)  Physical Exam  General:  Well developed, well nourished, in no acute distress. Neck:  Neck supple, JVP not elevated. No masses, thyromegaly or abnormal cervical nodes. Lungs:  Clear bilaterally to auscultation and percussion. Heart:  Non-displaced PMI, chest non-tender; regular rate and rhythm, S1, S2, +soft S4, 1/6 systolic crescendo-decrescendo murmur RUSB with normal S2. Carotid upstroke normal, no bruit. Pedals normal pulses. No edema, no varicosities. Abdomen:  Bowel sounds positive; abdomen soft and non-tender without masses, organomegaly,  or hernias noted. No hepatosplenomegaly. Extremities:  No clubbing or cyanosis. Neurologic:  Alert and oriented x 3. Psych:  Normal affect.   Impression & Recommendations:  Problem # 1:  CAD (ICD-414.00) Cath showed disease in small branch vessels, likely related to diabetes.  No obstructive disease in major vessels.  No recent chest pain. Plan for aggressive medical management.  Continue ASA, statin, Coreg, and ACEI.   Problem # 2:  DIASTOLIC HEART FAILURE, CHRONIC (ICD-428.32) Patient is euvolemic. No need for Lasix.  She is on a thiazide diuretic.   Problem # 3:   DYSLIPIDEMIA (ICD-272.4) Goal LDL < 70.  We will monitor.   Problem # 4:  HYPERTENSION (ICD-401.9) BP high today.  She has not, however, taken any of her meds.  She will check her BP on her meds daily and record in a notebook.  We will call her in 2 wks to see what BP is running.   Other Orders: Misc. Referral (Misc. Ref) TLB-Hepatic/Liver Function Pnl (80076-HEPATIC) TLB-Lipid Panel (80061-LIPID)  Patient Instructions: 1)  Your physician recommends that you have  a FASTING lipid profile/liver profile today 2)  Take and record your blood pressure and pulse--I will call you in 2 weeks to get the readings Luana Shu [phone] 3)  You have been referred to Dr Everardo All for management of your diabetes. 4)  Your physician wants you to follow-up in:  6 months with Dr Marca Ancona.  You will receive a reminder letter in the mail two months in advance. If you don't receive a letter, please call our office to schedule the follow-up appointment.

## 2010-08-02 NOTE — Assessment & Plan Note (Signed)
Summary: 1 wk fu --per pt  d/t---stc   Vital Signs:  Patient profile:   65 year old female Menstrual status:  hysterectomy Height:      66 inches (167.64 cm) Weight:      189.38 pounds (86.08 kg) BMI:     30.68 O2 Sat:      97 % on Room air Temp:     98.4 degrees F (36.89 degrees C) oral Pulse rate:   69 / minute BP sitting:   126 / 72  (left arm) Cuff size:   large  Vitals Entered By: Brenton Grills MA (March 12, 2010 7:55 AM)  O2 Flow:  Room air CC: 1 week F/U/refill on Tramadol/aj Is Patient Diabetic? Yes   Primary Judith Shepherd:  [name] MD  CC:  1 week F/U/refill on Tramadol/aj.  History of Present Illness: the status of at least 3 ongoing medical problems is addressed today: dm:  pt says she does not check cbg's.  she is surprised to heat her a1c is high.   pt states she feels well in general. anemia:  denies brbpr dyslipidemia:  she intermittently takes caduet.     Current Medications (verified): 1)  Glyburide-Metformin 2.5-500 Mg Tabs (Glyburide-Metformin) .Marland Kitchen.. 1 Tab By Mouth Two Times A Day 2)  Caduet 10-80 Mg Tabs (Amlodipine-Atorvastatin) .... Take One Tablet Every Other Day 3)  Aspirin 81 Mg Tbec (Aspirin) .... Take One Tablet By Mouth Daily 4)  Lisinopril 40 Mg Tabs (Lisinopril) .Marland Kitchen.. 1 Daily 5)  Chlorthalidone 25 Mg Tabs (Chlorthalidone) .... One Tablet Daily 6)  Coreg 25 Mg Tabs (Carvedilol) .... One Tablet Twice A Day 7)  Tramadol Hcl 50 Mg Tabs (Tramadol Hcl) .... One Tab By Mouth Q6h As Needed For Pain.  Allergies (verified): No Known Drug Allergies  Past History:  Past Medical History: Last updated: 12/15/2008 1.  DM2 2.  HTN 3.  Obesity 4.  Hyperlipidemia 5.  Diabetic peripheral neuropathy 6.  CAD:  Adenosine myoview (5/10): EF 38%, mild anterior ischemia.  LHC/RHC (5/10):  mean RA 3, mean PCWP 5, EF 65%.   Diffuse CAD in small branch vessels, consistent with diabetes.  No significant stenoses in the major vessels.   7.  CHF:  Probably  diastolic.  EF 65% on echo (myoview read as 38% but may be inaccurate).  Echo (5/10) with mod-severe LVH, EF 65%, diastolic dysfunction with evidence for increased LV filling pressure, no significant AS, mild LAE.   Review of Systems  The patient denies chest pain, hematochezia, and hematuria.         she has lost a few lbs, due to her efforts.  Physical Exam  General:  normal appearance.   Extremities:  no edema Additional Exam:  Hemoglobin           [L]  10.2 g/dL   14.7-82.9 Hematocrit           [L]  30.9 %     Hemoglobin A1C       [H]  11.7 %   Cholesterol LDL 130.7 mg/dL       Impression & Recommendations:  Problem # 1:  DM (ICD-250.00) poor control pt refuses insulin.  she is advised her of risks.    HgbA1C: 11.7 (02/23/2010)  Problem # 2:  ANEMIA (ICD-285.9)  Problem # 3:  DYSLIPIDEMIA (ICD-272.4) therapy limited by noncompliance.  i'll do the best i can.  Medications Added to Medication List This Visit: 1)  Actos 45 Mg Tabs (Pioglitazone hcl) .Marland KitchenMarland KitchenMarland Kitchen  1 tab each am 2)  Januvia 100 Mg Tabs (Sitagliptin phosphate) .Marland Kitchen.. 1 tab each am  Other Orders: Admin 1st Vaccine (53664) Flu Vaccine 31yrs + (40347) Est. Patient Level IV (42595)  Patient Instructions: 1)  add these medications:  actos 45 mg each am, and januvia 100 mg each am. 2)  Please schedule a follow-up appointment in 2 weeks. 3)  here are some tests for blood in the bowels.  please follow instructions, and return to lab. 4)  take iron (non-prescription) 1/day. 5)  you should take caduet once daily. Prescriptions: ACTOS 45 MG TABS (PIOGLITAZONE HCL) 1 tab each am  #30 x 11   Entered and Authorized by:   Minus Breeding MD   Signed by:   Minus Breeding MD on 03/12/2010   Method used:   Electronically to        [facility] (retail)       [address]       [place], [place]  [address]       Ph: [phone]       Fax: [phone]   RxID:   [id number] TRAMADOL HCL 50 MG  TABS (TRAMADOL HCL) One tab by mouth q6h as needed for pain.  #45 x 0   Entered and Authorized by:   Minus Breeding MD   Signed by:   Minus Breeding MD on 03/12/2010   Method used:   Electronically to        [facility] (retail)       [address]       [place], [place]  [address]       Ph: [phone]       Fax: [phone]   RxID:   [id number]  Flu Vaccine Consent Questions     Do you have a history of severe allergic reactions to this vaccine? no    Any prior history of allergic reactions to egg and/or gelatin? no    Do you have a sensitivity to the preservative Thimersol? no    Do you have a past history of Guillan-Barre Syndrome? no    Do you currently have an acute febrile illness? no    Have you ever had a severe reaction to latex? no    Vaccine information given and explained to patient? yes    Are you currently pregnant? no    Lot Number:[id number]   Exp Date:12/29/2010   Site Given  Left Deltoid IM   .lbflu

## 2010-08-02 NOTE — Progress Notes (Signed)
Summary: med questions  Phone Note Call from Patient Call back at Home Phone [phone]   Caller: Patient Reason for Call: Talk to Nurse Summary of Call: request to speak to nurse about meds, pharmacy told pt we have changed her meds Initial call taken by: Migdalia Dk,  January 04, 2010 10:20 AM    New/Updated Medications: COREG 25 MG TABS (CARVEDILOL) one tablet twice a day Prescriptions: COREG 25 MG TABS (CARVEDILOL) one tablet twice a day  #60 x 3   Entered by:   Katina Dung, RN, BSN   Authorized by:   Marca Ancona, MD   Signed by:   Katina Dung, RN, BSN on 01/04/2010   Method used:   Electronically to        [facility] (retail)       [address]       [place], [place]  [address]       Ph: [phone]       Fax: [phone]   RxID:   [id number]  per pt--pt not taking Toprol she is taking Coreg 25mg  twice a day  Current Medications (verified): 1)  Gabapentin 100 Mg Caps (Gabapentin) .Marland Kitchen.. 1 Tab Once Daily 2)  Glyburide-Metformin 2.5-500 Mg Tabs (Glyburide-Metformin) .Marland Kitchen.. 1 Tab By Mouth Two Times A Day 3)  Caduet 10-80 Mg Tabs (Amlodipine-Atorvastatin) .... Take One Tablet Every Other Day 4)  Aspirin 81 Mg Tbec (Aspirin) .... Take One Tablet By Mouth Daily 5)  Lisinopril 40 Mg Tabs (Lisinopril) .Marland Kitchen.. 1 Daily 6)  Chlorthalidone 25 Mg Tabs (Chlorthalidone) .... One Tablet Daily 7)  Coreg 25 Mg Tabs (Carvedilol) .... One Tablet Twice A Day 8)  Tramadol Hcl 50 Mg Tabs (Tramadol Hcl) .... One Tab By Mouth Q6h As Needed For Pain.  Allergies: No Known Drug Allergies

## 2010-08-02 NOTE — Assessment & Plan Note (Signed)
Summary: NEW ENDO/DIABETES/MEDICAID/#/CD   Vital Signs:  Patient profile:   65 year old female Menstrual status:  hysterectomy Height:      66 inches (167.64 cm) Weight:      187.13 pounds (85.06 kg) BMI:     30.31 O2 Sat:      98 % on Room air Temp:     98.6 degrees F (37.00 degrees C) oral Pulse rate:   73 / minute BP sitting:   142 / 80  (left arm) Cuff size:   large  Vitals Entered By: Brenton Grills MA (February 23, 2010 2:26 PM)  O2 Flow:  Room air CC: New Endo/DM/also wants to discuss dizziness/pt is no longer taking Gabapentin/aj Is Patient Diabetic? Yes     Menstrual Status hysterectomy   Primary Bosten Newstrom:  [name] MD  CC:  New Endo/DM/also wants to discuss dizziness/pt is no longer taking Gabapentin/aj.  History of Present Illness: pt states 17 years h/o dm.  it is complicated by cad and retinopathy.  she has never been on insulin.  she takes glucovance.  her current meter does not work. pt says her diet and exercise are "good."   symptomatically, pt states 5 mos of intermittent slight numbness of the feet, and associated slight unsteadiness.   Current Medications (verified): 1)  Gabapentin 100 Mg Caps (Gabapentin) .Marland Kitchen.. 1 Tab Once Daily 2)  Glyburide-Metformin 2.5-500 Mg Tabs (Glyburide-Metformin) .Marland Kitchen.. 1 Tab By Mouth Two Times A Day 3)  Caduet 10-80 Mg Tabs (Amlodipine-Atorvastatin) .... Take One Tablet Every Other Day 4)  Aspirin 81 Mg Tbec (Aspirin) .... Take One Tablet By Mouth Daily 5)  Lisinopril 40 Mg Tabs (Lisinopril) .Marland Kitchen.. 1 Daily 6)  Chlorthalidone 25 Mg Tabs (Chlorthalidone) .... One Tablet Daily 7)  Coreg 25 Mg Tabs (Carvedilol) .... One Tablet Twice A Day 8)  Tramadol Hcl 50 Mg Tabs (Tramadol Hcl) .... One Tab By Mouth Q6h As Needed For Pain.  Allergies (verified): No Known Drug Allergies  Past History:  Past Medical History: Last updated: 12/15/2008 1.  DM2 2.  HTN 3.  Obesity 4.  Hyperlipidemia 5.  Diabetic peripheral  neuropathy 6.  CAD:  Adenosine myoview (5/10): EF 38%, mild anterior ischemia.  LHC/RHC (5/10):  mean RA 3, mean PCWP 5, EF 65%.   Diffuse CAD in small branch vessels, consistent with diabetes.  No significant stenoses in the major vessels.   7.  CHF:  Probably diastolic.  EF 65% on echo (myoview read as 38% but may be inaccurate).  Echo (5/10) with mod-severe LVH, EF 65%, diastolic dysfunction with evidence for increased LV filling pressure, no significant AS, mild LAE.   Family History: Reviewed history from 11/28/2009 and no changes required. Mother died MI at 49 Sister died ESRD  ~ 36 Sister died from Ovarian Cancer  ~ 79 dm:  mother and 2 sibs  Social History: Reviewed history from 10/28/2008 and no changes required. Lives alone in [place].  Has children.  Nonsmoker, no ETOH.  retired divorced.Seat Belt Use:  yes Alcohol:  None  Review of Systems       The patient complains of weight gain.         denies headache, chest pain, sob, n/v, urinary frequency, cramps, memory loss, depression, hypoglycemia, slight rhinorrhea, and easy bruising .  she has chronic blurry vision, and night sweats.  Physical Exam  General:  normal appearance.   Head:  head: no deformity eyes: no periorbital swelling, no proptosis external nose and ears are  normal mouth: no lesion seen Neck:  Supple without thyroid enlargement or tenderness.  Lungs:  Clear to auscultation bilaterally. Normal respiratory effort.  Heart:  Regular rate and rhythm without murmurs or gallops noted. Normal S1,S2.   Msk:  muscle bulk and strength are grossly normal.  no obvious joint swelling.  gait is slow but steady  Pulses:  dorsalis pedis intact bilat.  no carotid bruit  Extremities:  no deformity.  no ulcer on the feet.  feet are of normal color and temp.   1+ right pedal edema and 1+ left pedal edema.   Neurologic:  cn 2-12 grossly intact.   readily moves all 4's.   sensation is intact to touch on the feet  Skin:   normal texture and temp.  no rash.  not diaphoretic  Cervical Nodes:  No significant adenopathy.  Psych:  Alert and cooperative; normal mood and affect; normal attention span and concentration.   Additional Exam:  Hemoglobin A1C       [H]  11.7 %  Hemoglobin           [L]  10.2 g/dL                   16.1-09.6   Hematocrit           [L]  30.9 %     Iron Saturation      [L]  15.4 %  Cholesterol LDL - Direct                             130.7 mg/dL   Impression & Recommendations:  Problem # 1:  DM (ICD-250.00) needs increased rx  Problem # 2:  numbness prob due to #1  Problem # 3:  ANEMIA (ICD-285.9) fe-deficiency uncertain etiology  Problem # 4:  DYSLIPIDEMIA (ICD-272.4) needs increased rx  Other Orders: TLB-Lipid Panel (80061-LIPID) TLB-BMP (Basic Metabolic Panel-BMET) (80048-METABOL) TLB-CBC Platelet - w/Differential (85025-CBCD) TLB-Hepatic/Liver Function Pnl (80076-HEPATIC) TLB-TSH (Thyroid Stimulating Hormone) (84443-TSH) TLB-IBC Pnl (Iron/FE;Transferrin) (83550-IBC) TLB-B12 + Folate Pnl (82746_82607-B12/FOL) TLB-A1C / Hgb A1C (Glycohemoglobin) (83036-A1C) TLB-Microalbumin/Creat Ratio, Urine (82043-MALB) TLB-Udip w/ Micro (81001-URINE) New Patient Level IV (04540) l  Patient Instructions: 1)  good diet and exercise habits significanly improve the control of your diabetes.  please let me know if you wish to be referred to a dietician.  high blood sugar is very risky to your health.  you should see an eye doctor every year. 2)  controlling your blood pressure and cholesterol drastically reduces the damage diabetes does to your body.  this also applies to quitting smoking.  please discuss these with your doctor.  you should take an aspirin every day, unless you have been advised by a doctor not to. 3)  we will need to take this complex situation in stages 4)  check your blood sugar 2 times a day.  vary the time of day when you check, between before the 3 meals, and at  bedtime.  also check if you have symptoms of your blood sugar being too high or too low.  please keep a record of the readings and bring it to your next appointment here.  please call us sooner if you are having low blood sugar episodes. 5)  here are 2 meters.  please decide which you like better, and call us, so i can prescribe you strips for it.   6)  please make an appointment with a new primary doctor  here at our office. 7)  Please schedule a follow-up appointment in 1 week. 8)  (update: i left message on phone-tree:  you should consider addition of bromocriptine and januvia.  please call if you agree).

## 2010-08-02 NOTE — Assessment & Plan Note (Signed)
Summary: new medicare pt--#--stc   Vital Signs:  Patient profile:   65 year old female Menstrual status:  hysterectomy Height:      66 inches (167.64 cm) Weight:      191.6 pounds (87.09 kg) O2 Sat:      97 % on Room air Temp:     98.7 degrees F (37.06 degrees C) oral Pulse rate:   76 / minute BP sitting:   152 / 72  (left arm) Cuff size:   regular  Vitals Entered By: Orlan Leavens RMA (May 14, 2010 9:46 AM)  O2 Flow:  Room air CC: New patient Is Patient Diabetic? Yes Did you bring your meter with you today? No Pain Assessment Patient in pain? no        Primary Care Umi Mainor:  [name] MD  CC:  New patient.  History of Present Illness: new pt to me, here to est with PCP - known to our endo  1) DM2 - complicated by neuropathy and retinopathy - follows with endo for same - reports sugars checked  with urine dip 2x/d, no signs or symptoms hypoglycemia - reports non compliance with ongoing rx'd medical treatment and no changes in medication dose or frequency. denies adverse side effects related to current therapy.   2) HTN - reports compliance with ongoing medical treatment and no changes in medication dose or frequency. denies adverse side effects related to current therapy.  no HA or CP -  3) dyslipidemia - reports compliance with ongoing medical treatment and no changes in medication dose or frequency. denies adverse side effects related to current therapy.   4) CAD - no active anginal symptoms -   5) OA - right knee - occ swelling and feels weak but not wanting to pursue TKR at this time  Preventive Screening-Counseling & Management  Alcohol-Tobacco     Smoking Status: never  Clinical Review Panels:  Lipid Management   Cholesterol:  208 (02/23/2010)   LDL (bad choesterol):  88 (05/31/2009)   HDL (good cholesterol):  53.90 (02/23/2010)  Diabetes Management   HgBA1C:  11.7 (02/23/2010)   Creatinine:  1.1 (02/23/2010)   Last Flu Vaccine:  Fluvax 3+  (03/12/2010)  CBC   WBC:  8.7 (02/23/2010)   RBC:  3.67 (02/23/2010)   Hgb:  10.2 (02/23/2010)   Hct:  30.9 (02/23/2010)   Platelets:  267.0 (02/23/2010)   MCV  84.3 (02/23/2010)   MCHC  33.0 (02/23/2010)   RDW  13.4 (02/23/2010)   PMN:  66.3 (02/23/2010)   Lymphs:  24.6 (02/23/2010)   Monos:  7.0 (02/23/2010)   Eosinophils:  1.8 (02/23/2010)   Basophil:  0.3 (02/23/2010)  Complete Metabolic Panel   Glucose:  121 (02/23/2010)   Sodium:  143 (02/23/2010)   Potassium:  3.9 (02/23/2010)   Chloride:  103 (02/23/2010)   CO2:  29 (02/23/2010)   BUN:  25 (02/23/2010)   Creatinine:  1.1 (02/23/2010)   Albumin:  3.4 (02/23/2010)   Total Protein:  6.4 (02/23/2010)   Calcium:  9.5 (02/23/2010)   Total Bili:  0.3 (02/23/2010)   Alk Phos:  66 (02/23/2010)   SGPT (ALT):  10 (02/23/2010)   SGOT (AST):  18 (02/23/2010)   Current Medications (verified): 1)  Glyburide-Metformin 2.5-500 Mg Tabs (Glyburide-Metformin) .Marland Kitchen.. 1 Tab By Mouth Two Times A Day 2)  Caduet 10-80 Mg Tabs (Amlodipine-Atorvastatin) .... Take One Tablet Every Other Day 3)  Aspirin 81 Mg Tbec (Aspirin) .... Take One Tablet By Mouth Daily  4)  Lisinopril 40 Mg Tabs (Lisinopril) .Marland Kitchen.. 1 Daily 5)  Chlorthalidone 25 Mg Tabs (Chlorthalidone) .... One Tablet Daily 6)  Coreg 25 Mg Tabs (Carvedilol) .... One Tablet Twice A Day 7)  Tramadol Hcl 50 Mg Tabs (Tramadol Hcl) .... One Tab By Mouth Q6h As Needed For Pain. 8)  Actos 45 Mg Tabs (Pioglitazone Hcl) .Marland Kitchen.. 1 Tab Each Am 9)  Januvia 100 Mg Tabs (Sitagliptin Phosphate) .Marland Kitchen.. 1 Tab Each Am  Allergies (verified): No Known Drug Allergies  Past History:  Past Medical History: 1.  DM2 2.  HTN 3.  Obesity 4.  Hyperlipidemia 5.  Diabetic peripheral neuropathy 6.  CAD:  Adenosine myoview (5/10): EF 38%, mild anterior ischemia.  LHC/RHC (5/10):  mean RA 3, mean PCWP 5, EF 65%.   Diffuse CAD in small branch vessels, consistent with diabetes.  No significant stenoses in the major  vessels.   7.  CHF:  Probably diastolic.  EF 65% on echo (myoview read as 38% but may be inaccurate).  Echo (5/10) with mod-severe LVH, EF 65%, diastolic dysfunction with evidence for increased LV filling pressure, no significant AS, mild LAE.   MD roster: cards - [facility] - [facility] - [facility] - [facility]  Past Surgical History: Hysterectomy  Family History: Mother died MI at 58  Sister died ESRD  ~ 53 Sister died from Ovarian Cancer  ~ 71 dm:  mother and 2 sibs   Social History: Lives alone in [place], divorced Has children.   Nonsmoker, no ETOH.   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 I have reviewed all other systems and they were negative.   Physical Exam  General:  alert, well-developed, well-nourished, and cooperative to examination.    Eyes:  vision grossly intact; pupils equal, round and reactive to light.  conjunctiva and lids normal.    Ears:  normal pinnae bilaterally, without erythema, swelling, or tenderness to palpation. TMs clear, without effusion, or cerumen impaction. Hearing grossly normal bilaterally  Mouth:  gold teeth and gums in good repair; mucous membranes moist, without lesions or ulcers. oropharynx clear without exudate, erythema.  Neck:  supple, full ROM, no masses, no thyromegaly; no thyroid nodules or tenderness. no JVD or carotid bruits.   Lungs:  normal respiratory effort, no intercostal retractions or use of accessory muscles; normal breath sounds bilaterally - no crackles and no wheezes.    Heart:  normal rate, regular rhythm, no murmur, and no rub. BLE without edema.  Abdomen:  soft, non-tender, normal bowel sounds, no distention; no masses and no appreciable hepatomegaly or splenomegaly.   Genitalia:  defer gyn Msk:  No deformity or scoliosis noted of thoracic or lumbar spine.   right knee: decreased range of motion, diffuse boggy synovitis. Tender to palpation  on joint line. Increased pain with weight bearing. Positive crepitus.  Neurologic:  alert & oriented X3 and cranial nerves II-XII symetrically intact.  strength normal in all extremities, sensation intact to light touch, and gait normal. speech fluent without dysarthria or aphasia; follows commands with good comprehension.  Psych:  Normal affect.   Impression & Recommendations:  Problem # 1:  KNEE PAIN, BILATERAL (ICD-719.46)  follow with ortho if symptoms worse - cont same meds Her updated medication list for this problem includes:    Aspirin 81 Mg Tbec (Aspirin) .Marland Kitchen... Take one tablet by mouth daily  Tramadol Hcl 50 Mg Tabs (Tramadol hcl) ..... One tab by mouth q6h as needed for pain.  Discussed strengthening exercises, use of ice or heat, and medications.   Problem # 2:  DM (ICD-250.00) follows with endo for sanme - complicated by noncompliance encouraged to consider insulin Her updated medication list for this problem includes:    Glyburide-metformin 2.5-500 Mg Tabs (Glyburide-metformin) .Marland Kitchen... 1 tab by mouth two times a day    Aspirin 81 Mg Tbec (Aspirin) .Marland Kitchen... Take one tablet by mouth daily    Lisinopril 40 Mg Tabs (Lisinopril) .Marland Kitchen... 1 daily    Actos 45 Mg Tabs (Pioglitazone hcl) .Marland Kitchen... 1 tab each am    Januvia 100 Mg Tabs (Sitagliptin phosphate) .Marland Kitchen... 1 tab each am  Labs Reviewed: Creat: 1.1 (02/23/2010)    Reviewed HgBA1c results: 11.7 (02/23/2010)  11.4 (11/28/2009)  Problem # 3:  HYPERTENSION (ICD-401.9)  Her updated medication list for this problem includes:    Caduet 10-80 Mg Tabs (Amlodipine-atorvastatin) .Marland Kitchen... Take one tablet every other day    Lisinopril 40 Mg Tabs (Lisinopril) .Marland Kitchen... 1 daily    Chlorthalidone 25 Mg Tabs (Chlorthalidone) ..... One tablet daily    Coreg 25 Mg Tabs (Carvedilol) ..... One tablet twice a day  Not at goal. 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 Take one tablet every other day    Aspirin 81 Mg Tbec (Aspirin) .Marland Kitchen... Take one tablet by mouth daily    Lisinopril 40 Mg Tabs (Lisinopril) .Marland Kitchen... 1 daily    Chlorthalidone 25 Mg Tabs (Chlorthalidone) ..... One tablet daily    Coreg 25 Mg Tabs (Carvedilol) ..... One tablet twice a day  Labs Reviewed: Chol: 208 (02/23/2010)   HDL: 53.90 (02/23/2010)   LDL: 88 (05/31/2009)   TG: 93.0 (02/23/2010)  prior 2010 cath showed disease in small branch vessels, likely related to diabetes.  No obstructive disease in major vessels.  No recent chest pain. Plan for aggressive medical management.  Continue ASA, statin, Coreg, and ACEI.  Time spent with patient 30 minutes, more than 50% of this time was spent counseling patient on need for med compliance and better BP and DM control as well as need for inc exercise efforts and weight control  Complete Medication List: 1)  Glyburide-metformin 2.5-500 Mg Tabs (Glyburide-metformin) .Marland Kitchen.. 1 tab by mouth two times a day 2)  Caduet 10-80 Mg Tabs (Amlodipine-atorvastatin) .... Take one tablet every other day 3)  Aspirin 81 Mg Tbec (Aspirin) .... Take one tablet by mouth daily 4)  Lisinopril 40 Mg Tabs (Lisinopril) .Marland Kitchen.. 1 daily 5)  Chlorthalidone 25 Mg Tabs (Chlorthalidone) .... One tablet daily 6)  Coreg 25 Mg Tabs (Carvedilol) .... One tablet twice a day 7)  Tramadol Hcl 50 Mg Tabs (Tramadol hcl) .... One tab by mouth q6h as needed for pain. 8)  Actos 45 Mg Tabs (Pioglitazone hcl) .Marland Kitchen.. 1 tab each am 9)  Januvia 100 Mg Tabs (Sitagliptin phosphate) .Marland Kitchen.. 1 tab each am  Patient Instructions: 1)  it was good to see  you today. 2)  make appointment with Dr.  Everardo All to discuss possible insulin use for your diabetes - 3)  remember to talke medications as prescribed and call your doctor if questions or concerns - 4)  it is important that you work on losing weight - monitor your diet and consume fewer calories such as less carbohydrates (sugar) and less fat. you also need to increase your physical activity level - start by walking for 10-20 minutes 3 times per week and work up to 30 minutes 4-5 times each week.  5)  Please schedule a follow-up appointment in 3-4 months to review blood pressure and diabetes, call sooner if problems.    Orders Added: 1)  Est. Patient Level IV [16109]

## 2010-08-02 NOTE — Progress Notes (Signed)
Summary: referral  Phone Note Call from Patient Call back at Home Phone [phone]   Caller: Patient Summary of Call: pt is requesting to go to phys therapy for her knees.   Initial call taken by: De Nurse,  January 23, 2010 3:29 PM  Follow-up for Phone Call        will forward  message to MD. Follow-up by: Theresia Lo RN,  January 23, 2010 4:19 PM  Additional Follow-up for Phone Call Additional follow up Details #1::        Okay to give verbal order for physical therapy Additional Follow-up by: [name] MD,  January 24, 2010 12:20 PM  New Problems: KNEE PAIN, BILATERAL (ICD-719.46)   Additional Follow-up for Phone Call Additional follow up Details #2::    Referral faxed to [facility], patient informed that they would be calling her with an appt. Follow-up by: Garen Grams LPN,  January 24, 2010 2:14 PM  New Problems: KNEE PAIN, BILATERAL (ICD-719.46)

## 2010-08-02 NOTE — Progress Notes (Signed)
Summary: phn msg  Phone Note Call from Patient Call back at Home Phone [phone]   Caller: Patient Summary of Call: Pt said she was to have a colonoscopy and asking would we call her when it was scheduled. Initial call taken by: Clydell Hakim,  Nov 28, 2009 2:49 PM  Follow-up for Phone Call        will schedule and notify patient . Theresia Lo RN  November 29, 2009 2:29 PM  Follow-up by: Theresia Lo RN,  November 29, 2009 2:29 PM

## 2010-08-02 NOTE — Progress Notes (Signed)
Summary: results  Phone Note Call from Patient Call back at Home Phone [phone]   Caller: Patient Summary of Call: wants to know results of xrays Initial call taken by: De Nurse,  December 19, 2009 9:31 AM  Follow-up for Phone Call        will forward to MD. Follow-up by: Theresia Lo RN,  December 19, 2009 11:00 AM  Additional Follow-up for Phone Call Additional follow up Details #1::        Some arthritis, worse in right knee.  Hip normal.  She is supposed to fu with me 2 wks after initial visit if still hurting. Additional Follow-up by: Rodney Langton MD,  December 19, 2009 1:52 PM    Additional Follow-up for Phone Call Additional follow up Details #2::    patient notified. Follow-up by: Theresia Lo RN,  December 19, 2009 2:33 PM

## 2010-08-02 NOTE — Consult Note (Signed)
Summary: [facility]  [facility]   Imported By: Clydell Hakim 12/13/2009 13:57:52  _____________________________________________________________________  External Attachment:    Type:   Image     Comment:   External Document

## 2010-08-02 NOTE — Assessment & Plan Note (Signed)
Summary: had a fall on Monday,tcb   Vital Signs:  Patient profile:   65 year old female Height:      66 inches Weight:      184.8 pounds BMI:     29.94 Temp:     98.4 degrees F oral Pulse rate:   79 / minute BP sitting:   185 / 84  (left arm) Cuff size:   large  Vitals Entered By: Gladstone Pih (December 14, 2009 2:21 PM)  Serial Vital Signs/Assessments:  Time      Position  BP       Pulse  Resp  Temp     By 2:24 PM             182/72                         Gladstone Pih  Comments: 2:24 PM re checked manually By: Gladstone Pih   CC: C/O pain bilat knees and Left hip Is Patient Diabetic? No Pain Assessment Patient in pain? yes     Location: knee, l hip Intensity: 7 Type: aching Onset of pain  since Mon Comments fell on Mon getting onto exam table at GI office   Primary Care Provider:  [name] MD  CC:  C/O pain bilat knees and Left hip.  History of Present Illness: Pt fell 3 d ago while getting on table for colonoscopy.  Hit knees and left hip.  Was able to complete colonoscopy and went home but had cont'd pain.  Ambulatory but painful.    L knee: worst pain, posteromedial, clicking, catches, cannot get to full flexion.  Swelling started approx 2-3d after injury.  L hip:  Hurts on lateral aspect.  Able to walk.  Habits & Providers  Alcohol-Tobacco-Diet     Tobacco Status: never  Current Medications (verified): 1)  Gabapentin 100 Mg Caps (Gabapentin) .Marland Kitchen.. 1 Tab Once Daily 2)  Glyburide-Metformin 2.5-500 Mg Tabs (Glyburide-Metformin) .Marland Kitchen.. 1 Tab By Mouth Two Times A Day 3)  Caduet 10-80 Mg Tabs (Amlodipine-Atorvastatin) .... Take One Tablet Every Other Day 4)  Aspirin 81 Mg Tbec (Aspirin) .... Take One Tablet By Mouth Daily 5)  Lisinopril 40 Mg Tabs (Lisinopril) .Marland Kitchen.. 1 Daily 6)  Chlorthalidone 25 Mg Tabs (Chlorthalidone) .... One Tablet Daily 7)  Toprol Xl 50 Mg Xr24h-Tab (Metoprolol Succinate) 8)  Tramadol Hcl 50 Mg Tabs (Tramadol Hcl) .... One Tab By  Mouth Q6h As Needed For Pain.  Allergies (verified): No Known Drug Allergies  Review of Systems       See HPI  Physical Exam  General:  Well-developed,well-nourished,in no acute distress; alert,appropriate and cooperative throughout examination Msk:  Knee: L Swollen but not erythematous. Palpation normal with no warmth or joint line tenderness or patellar tenderness or condyle tenderness. ROM normal in extension and lower leg rotation.  Very painful and catches at about 120 deg flexion. Ligaments with solid consistent endpoints including ACL, PCL, LCL, MCL. Mcmurrays painful but no pop, unable to to apleys (pt cannot get onto bed), and Thessaly too painful Non painful patellar compression. Patellar and quadriceps tendons unremarkable. Hamstring and quadriceps strength is normal.    Knee:R Normal to inspection with no erythema or effusion or obvious bony abnormalities. Palpation normal with no warmth or joint line tenderness or patellar tenderness or condyle tenderness. ROM normal in flexion and extension and lower leg rotation. Ligaments with solid consistent endpoints including ACL, PCL, LCL, MCL. Negative Mcmurray's and  provocative meniscal tests. Non painful patellar compression. Patellar and quadriceps tendons unremarkable. Hamstring and quadriceps strength is normal.    Hip:L ROM IR: 50 Deg, ER: 50 Deg, Flexion: 90 Deg, Extension: 80 Deg, Abduction: 45 Deg, Adduction: 45 Deg Pelvic alignment unremarkable to inspection and palpation. Greater trochanter WITH tenderness to palpation. No SI joint tenderness.   Impression & Recommendations:  Problem # 1:  ACCIDENTAL FALL (ICD-E888.9) Assessment New Will XR both knees and hip, concern for meniscal injury to left knee with swelling 2-3 d after injury, locking/catching.  Offered therapeutic/diagnostic knee injection but pt declined, would like to be conservative.  Will rx tramadol.  RTC if no better in 2 weeks. fu  XRs. Orders: Diagnostic X-Ray/Fluoroscopy (Diagnostic X-Ray/Flu) [facility]- Est  Level 4 (95621)  Complete Medication List: 1)  Gabapentin 100 Mg Caps (Gabapentin) .Marland Kitchen.. 1 tab once daily 2)  Glyburide-metformin 2.5-500 Mg Tabs (Glyburide-metformin) .Marland Kitchen.. 1 tab by mouth two times a day 3)  Caduet 10-80 Mg Tabs (Amlodipine-atorvastatin) .... Take one tablet every other day 4)  Aspirin 81 Mg Tbec (Aspirin) .... Take one tablet by mouth daily 5)  Lisinopril 40 Mg Tabs (Lisinopril) .Marland Kitchen.. 1 daily 6)  Chlorthalidone 25 Mg Tabs (Chlorthalidone) .... One tablet daily 7)  Toprol Xl 50 Mg Xr24h-tab (Metoprolol succinate) 8)  Tramadol Hcl 50 Mg Tabs (Tramadol hcl) .... One tab by mouth q6h as needed for pain.  Patient Instructions: 1)  XRays, rest, pain medicine, 2)  Will call you if there is a fracture on the xrays.   3)  Come back to see [place] if no better in 2 weeks. 4)  -Dr. Karie Schwalbe. Prescriptions: TRAMADOL HCL 50 MG TABS (TRAMADOL HCL) One tab by mouth q6h as needed for pain.  #45 x 0   Entered and Authorized by:   Rodney Langton MD   Signed by:   Rodney Langton MD on 12/14/2009   Method used:   Electronically to        [facility] (retail)       [address]       [place], [place]  [address]       Ph: [phone]       Fax: [phone]   RxID:   [id number]

## 2010-08-02 NOTE — Assessment & Plan Note (Signed)
Summary: f/u appt/cd   Vital Signs:  Patient profile:   65 year old female Menstrual status:  hysterectomy Height:      66 inches (167.64 cm) Weight:      193 pounds (87.73 kg) BMI:     31.26 O2 Sat:      98 % on Room air Temp:     99.2 degrees F (37.33 degrees C) oral Pulse rate:   84 / minute Pulse rhythm:   regular BP sitting:   132 / 88  (left arm) Cuff size:   regular  Vitals Entered By: Brenton Grills CMA Duncan Dull) (June 04, 2010 10:02 AM)  O2 Flow:  Room air CC: Follow-up visit/numbness in both hands/aj Is Patient Diabetic? Yes   CC:  Follow-up visit/numbness in both hands/aj.  History of Present Illness: pt states few mos of moderate numbness of the hands, and assoc pain.   no cbg record, but states cbg's are well-controlled  Current Medications (verified): 1)  Glyburide-Metformin 2.5-500 Mg Tabs (Glyburide-Metformin) .Marland Kitchen.. 1 Tab By Mouth Two Times A Day 2)  Caduet 10-80 Mg Tabs (Amlodipine-Atorvastatin) .... Take One Tablet Every Other Day 3)  Aspirin 81 Mg Tbec (Aspirin) .... Take One Tablet By Mouth Daily 4)  Lisinopril 40 Mg Tabs (Lisinopril) .Marland Kitchen.. 1 Daily 5)  Chlorthalidone 25 Mg Tabs (Chlorthalidone) .... One Tablet Daily 6)  Coreg 25 Mg Tabs (Carvedilol) .... One Tablet Twice A Day 7)  Tramadol Hcl 50 Mg Tabs (Tramadol Hcl) .... One Tab By Mouth Q6h As Needed For Pain. 8)  Actos 45 Mg Tabs (Pioglitazone Hcl) .Marland Kitchen.. 1 Tab Each Am 9)  Januvia 100 Mg Tabs (Sitagliptin Phosphate) .Marland Kitchen.. 1 Tab Each Am  Allergies (verified): No Known Drug Allergies  Past History:  Past Medical History: Last updated: 05/14/2010 1.  DM2 2.  HTN 3.  Obesity 4.  Hyperlipidemia 5.  Diabetic peripheral neuropathy 6.  CAD:  Adenosine myoview (5/10): EF 38%, mild anterior ischemia.  LHC/RHC (5/10):  mean RA 3, mean PCWP 5, EF 65%.   Diffuse CAD in small branch vessels, consistent with diabetes.  No significant stenoses in the major vessels.   7.  CHF:  Probably diastolic.  EF 65% on  echo (myoview read as 38% but may be inaccurate).  Echo (5/10) with mod-severe LVH, EF 65%, diastolic dysfunction with evidence for increased LV filling pressure, no significant AS, mild LAE.   MD roster: cards - [facility] - [facility] - [facility] - [facility]  Review of Systems  The patient denies weight loss and weight gain.    Physical Exam  General:  normal appearance.   Msk:  strength is decreased on the hands, perhaps due to  pain.   Pulses:  radial intact bilaterally.   Extremities:  hands are normal, except as otherwist noted here Neurologic:  sensation is intact to touch on the hands, but decreased from normal Additional Exam:  Hemoglobin A1C       [H]  9.8 %                       4.6-6.5    White Cell Count          9.1 K/uL                    4.5-10.5   Red Cell Count       [L]  3.65 Mil/uL  3.87-5.11   Hemoglobin           [L]  10.3 g/dL                   16.1-09.6   Hematocrit           [L]  30.7 %                      36.0-46.0   MCV                       83.9 fl                     78.0-100.0   MCHC                      33.5 g/dL                   04.5-40.9   RDW                       13.2 %                      11.5-14.6   Platelet Count            243.0 K/uL                  150.0-400.0  Iron Saturation      [L]  15.9 %                      20.0-50.0 LDL Cholesterol           92 mg/dL     Impression & Recommendations:  Problem # 1:  CARPAL TUNNEL SYNDROME, BILATERAL (ICD-354.0) Assessment New  Problem # 2:  DM (ICD-250.00) she will need insulin to control this  Problem # 3:  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 Bilateral wrist splints  Other Orders: Diabetic Clinic Referral (Diabetic) TLB-A1C / Hgb A1C (Glycohemoglobin) (83036-A1C) TLB-CBC Platelet - w/Differential (85025-CBCD) TLB-IBC Pnl (Iron/FE;Transferrin) (83550-IBC) TLB-Lipid Panel  (80061-LIPID) TLB-B12, Serum-Total ONLY (81191-Y78) Est. Patient Level IV (29562)  Patient Instructions: 1)  blood tests are being ordered for you today.  please call [phone] to hear your test results. 2)  here is a prescription for wrist splints. 3)  Please schedule a follow-up appointment in 6 months. 4)  refer to a dietician. you will be called with a day and time for an appointment 5)  (update: i left message on phone-tree:  take fe 1/day.  you need insulin to control your blood sugar.  please call back, and i can refer you to a diebetes educator.  you will be sent hemoccults.  please follow instructions, and return to lab).   Prescriptions: 354.9 bilateral wrist splints  #1 pr x 0   Entered and Authorized by:   Minus Breeding MD   Signed by:   Minus Breeding MD on 06/04/2010   Method used:   Print then Give to Patient   RxID:   [id number]    Orders Added: 1)  Diabetic Clinic Referral [Diabetic] 2)  TLB-A1C / Hgb A1C (Glycohemoglobin) [83036-A1C] 3)  TLB-CBC Platelet - w/Differential [85025-CBCD] 4)  TLB-IBC Pnl (Iron/FE;Transferrin) [83550-IBC] 5)  TLB-Lipid Panel [80061-LIPID] 6)  TLB-B12, Serum-Total ONLY [01027-O53]  7)  Est. Patient Level IV [09811]

## 2010-08-02 NOTE — Consult Note (Signed)
Summary: [facility]   [facility]   Imported By: Clydell Hakim 01/26/2010 13:35:57  _____________________________________________________________________  External Attachment:    Type:   Image     Comment:   External Document  Appended Document: [facility]      Colonoscopy  Procedure date:  01/29/2010  Findings:      Results: Normal.   Comments:      Repeat colonoscopy in 10 years.    Allergies: No Known Drug Allergies   Complete Medication List: 1)  Gabapentin 100 Mg Caps (Gabapentin) .Marland Kitchen.. 1 tab once daily 2)  Glyburide-metformin 2.5-500 Mg Tabs (Glyburide-metformin) .Marland Kitchen.. 1 tab by mouth two times a day 3)  Caduet 10-80 Mg Tabs (Amlodipine-atorvastatin) .... Take one tablet every other day 4)  Aspirin 81 Mg Tbec (Aspirin) .... Take one tablet by mouth daily 5)  Lisinopril 40 Mg Tabs (Lisinopril) .Marland Kitchen.. 1 daily 6)  Chlorthalidone 25 Mg Tabs (Chlorthalidone) .... One tablet daily 7)  Coreg 25 Mg Tabs (Carvedilol) .... One tablet twice a day 8)  Tramadol Hcl 50 Mg Tabs (Tramadol hcl) .... One tab by mouth q6h as needed for pain.   Prevention & Chronic Care Immunizations   Influenza vaccine: Not documented    Tetanus booster: Not documented    Pneumococcal vaccine: Not documented    H. zoster vaccine: Not documented  Colorectal Screening   Hemoccult: Not documented    Colonoscopy: Results: Normal.   (01/29/2010)   Colonoscopy action/deferral: Repeat colonoscopy in 10 years.    (01/29/2010)  Other Screening   Pap smear: Not documented    Mammogram: Not documented    DXA bone density scan: Not documented   Smoking status: never  (12/14/2009)  Diabetes Mellitus   HgbA1C: 11.4  (11/28/2009)   HgbA1C action/deferral: Ordered  (11/28/2009)    Eye exam: Not documented    Foot exam: Not documented   High risk foot: Not documented   Foot care education: Not documented    Urine microalbumin/creatinine ratio: Not  documented  Lipids   Total Cholesterol: 154  (05/31/2009)   LDL: 88  (05/31/2009)   LDL Direct: Not documented   HDL: 50.20  (05/31/2009)   Triglycerides: 78.0  (05/31/2009)    SGOT (AST): 18  (05/31/2009)   SGPT (ALT): 22  (05/31/2009)   Alkaline phosphatase: 90  (05/31/2009)   Total bilirubin: 0.5  (05/31/2009)  Hypertension   Last Blood Pressure: 185 / 84  (12/14/2009)   Serum creatinine: 1.1  (06/27/2009)   Serum potassium 4.1  (06/27/2009)  Self-Management Support :   Personal Goals (by the next clinic visit) :     Personal A1C goal: 8  (11/28/2009)     Personal blood pressure goal: 140/90  (11/28/2009)     Personal LDL goal: 130  (11/28/2009)    Diabetes self-management support: Not documented    Hypertension self-management support: Not documented    Lipid self-management support: Not documented

## 2010-08-02 NOTE — Progress Notes (Signed)
Summary: pt needs referral to see dr. Everardo All  Phone Note Call from Patient Call back at Home Phone [phone]   Caller: Patient Reason for Call: Talk to Nurse, Talk to Doctor Summary of Call: pt needs referral to Dr. Everardo All office Initial call taken by: Omer Jack,  February 06, 2010 11:14 AM  Follow-up for Phone Call        per pt calling back regarding dr. Everardo All [phone].  pt was told by dr. Everardo All office to have nurse to call referral over Judith Shepherd  February 07, 2010 10:07 AM --appt made for pt with Dr Everardo All for Friday August 26 at [facility] --pt aware

## 2010-08-02 NOTE — Miscellaneous (Signed)
  Clinical Lists Changes  Medications: Added new medication of TOPROL XL 50 MG XR24H-TAB (METOPROLOL SUCCINATE) Removed medication of COREG 25 MG TABS (CARVEDILOL) one tablet twice a day Observations: Added new observation of PAST SURG HX: Hysterectomy (11/21/2009 10:54) Added new observation of PRIMARY MD: Dr. Pecola Leisure (11/21/2009 10:54)       Past History:  Past Surgical History: Hysterectomy    Prevention & Chronic Care Immunizations   Influenza vaccine: Not documented    Tetanus booster: Not documented    Pneumococcal vaccine: Not documented    H. zoster vaccine: Not documented  Colorectal Screening   Hemoccult: Not documented    Colonoscopy: Not documented  Other Screening   Pap smear: Not documented    Mammogram: Not documented    DXA bone density scan: Not documented   Smoking status: Not documented  Diabetes Mellitus   HgbA1C: Not documented    Eye exam: Not documented    Foot exam: Not documented   High risk foot: Not documented   Foot care education: Not documented    Urine microalbumin/creatinine ratio: Not documented  Lipids   Total Cholesterol: 154  (05/31/2009)   LDL: 88  (05/31/2009)   LDL Direct: Not documented   HDL: 50.20  (05/31/2009)   Triglycerides: 78.0  (05/31/2009)    SGOT (AST): 18  (05/31/2009)   SGPT (ALT): 22  (05/31/2009)   Alkaline phosphatase: 90  (05/31/2009)   Total bilirubin: 0.5  (05/31/2009)  Hypertension   Last Blood Pressure: 176 / 84  (08/14/2009)   Serum creatinine: 1.1  (06/27/2009)   Serum potassium 4.1  (06/27/2009)  Self-Management Support :    Diabetes self-management support: Not documented    Hypertension self-management support: Not documented    Lipid self-management support: Not documented

## 2010-08-02 NOTE — Progress Notes (Signed)
Summary: B/P readings  Phone Note Outgoing Call   Call placed by: Katina Dung, RN, BSN,  August 28, 2009 11:53 AM Call placed to: Patient Summary of Call: get B/P readings  Follow-up for Phone Call        call to get B/P readings--pt had not taken meds at time of 08-14-09 OV with Dr Shirlee Latch [facility] Katina Dung, RN, BSN  August 28, 2009 2:17 PM  [facility] Katina Dung, RN, BSN  August 29, 2009 11:21 AM  discussed with pt--she has not taken her B/P regularly-her sister is under the care of Hospice out of state and she is going to be travelling to see her--pt will take and record her blood pressure and call me in a couple of weeks

## 2010-08-06 ENCOUNTER — Encounter: Payer: Self-pay | Admitting: Endocrinology

## 2010-08-08 NOTE — Progress Notes (Signed)
Summary: referral  Phone Note Call from Patient Call back at Home Phone [phone]   Caller: Patient Summary of Call: Pt called requesting referral to Podiatrist for DM foot care Initial call taken by: Margaret Pyle, CMA,  July 31, 2010 10:55 AM  Follow-up for Phone Call        sent ov is due Follow-up by: Minus Breeding MD,  July 31, 2010 11:48 AM

## 2010-08-15 ENCOUNTER — Telehealth: Payer: Self-pay | Admitting: Internal Medicine

## 2010-08-22 NOTE — Progress Notes (Signed)
Summary: CADUET---OUT OF THIS MED---  Phone Note Call from Patient Call back at Home Phone [phone]   Caller: Patient Call For: Romero Belling Summary of Call: Pt requesting Caduet be called into [facility].  Pt is out of this med.---Pt Ph#  [phone] Initial call taken by: Ivar Bury,  August 15, 2010 11:55 AM  Follow-up for Phone Call        Sent med to [facility]. Pt was notified Follow-up by: Orlan Leavens RMA,  August 15, 2010 12:13 PM    Prescriptions: CADUET 10-80 MG TABS (AMLODIPINE-ATORVASTATIN) take one tablet every other day  #30 Each x 5   Entered by:   Orlan Leavens RMA   Authorized by:   Newt Lukes MD   Signed by:   Orlan Leavens RMA on 08/15/2010   Method used:   Electronically to        [facility] (retail)       [address]       [place], [place]  [address]       Ph: [phone]       Fax: [phone]   RxID:   [id number]

## 2010-08-29 ENCOUNTER — Telehealth (INDEPENDENT_AMBULATORY_CARE_PROVIDER_SITE_OTHER): Payer: Self-pay | Admitting: *Deleted

## 2010-08-30 ENCOUNTER — Encounter: Payer: Self-pay | Admitting: Internal Medicine

## 2010-09-06 NOTE — Progress Notes (Signed)
Summary: PA-Amlodipine  Phone Note From Pharmacy   Summary of Call: PA-Amlodipine, called [facility] @ [phone], awaituing  form. Dagoberto Reef  August 29, 2010 11:49 AM Form to Dr Felicity Coyer to complete. Dagoberto Reef  August 29, 2010 11:50 AM PA faxed to [phone], awaiting approval. Dagoberto Reef  August 30, 2010 2:56 PM Pa approved through 07/01/2011, pt aware.   Initial call taken by: Dagoberto Reef,  August 31, 2010 3:52 PM

## 2010-09-10 ENCOUNTER — Ambulatory Visit: Payer: Self-pay | Admitting: Internal Medicine

## 2010-09-11 NOTE — Medication Information (Signed)
Summary: Cad[facility] / [facility]  Cad[facility] / [facility]   Imported By: Lennie Odor 09/03/2010 11:47:31  _____________________________________________________________________  External Attachment:    Type:   Image     Comment:   External Document

## 2010-09-11 NOTE — Medication Information (Signed)
Summary: Approved Caduet / [facility]  Approved Caduet / [facility]   Imported By: Lennie Odor 09/03/2010 11:49:40  _____________________________________________________________________  External Attachment:    Type:   Image     Comment:   External Document

## 2010-09-26 ENCOUNTER — Encounter: Payer: Self-pay | Admitting: Internal Medicine

## 2010-09-28 ENCOUNTER — Other Ambulatory Visit (INDEPENDENT_AMBULATORY_CARE_PROVIDER_SITE_OTHER): Payer: Medicare Other

## 2010-09-28 ENCOUNTER — Ambulatory Visit (INDEPENDENT_AMBULATORY_CARE_PROVIDER_SITE_OTHER): Payer: Medicare Other | Admitting: Internal Medicine

## 2010-09-28 ENCOUNTER — Encounter: Payer: Self-pay | Admitting: Internal Medicine

## 2010-09-28 DIAGNOSIS — I10 Essential (primary) hypertension: Secondary | ICD-10-CM

## 2010-09-28 DIAGNOSIS — E119 Type 2 diabetes mellitus without complications: Secondary | ICD-10-CM

## 2010-09-28 DIAGNOSIS — M25569 Pain in unspecified knee: Secondary | ICD-10-CM

## 2010-09-28 DIAGNOSIS — E785 Hyperlipidemia, unspecified: Secondary | ICD-10-CM

## 2010-09-28 DIAGNOSIS — D649 Anemia, unspecified: Secondary | ICD-10-CM

## 2010-09-28 LAB — FERRITIN: Ferritin: 16.4 ng/mL (ref 10.0–291.0)

## 2010-09-28 LAB — HEMOGLOBIN A1C: Hgb A1c MFr Bld: 14.9 % — ABNORMAL HIGH (ref 4.6–6.5)

## 2010-09-28 NOTE — Assessment & Plan Note (Addendum)
Follows with endo for same - improving but still uncontrolled - recheck a1c now and defer med mgmt to endo Working on diet and exercise with weight reduction Lab Results  Component Value Date   HGBA1C 9.8* 06/04/2010   Wt Readings from Last 3 Encounters:  09/28/10 186 lb 1.9 oz (84.423 kg)  06/04/10 193 lb (87.544 kg)  05/14/10 191 lb 9.6 oz (86.909 kg)

## 2010-09-28 NOTE — Assessment & Plan Note (Signed)
OA, follows with ortho for same as needed Requests new handicap tag for 2nd car - temporary tag provided, pt to follow up with ortho for permanent tag/plate as needed

## 2010-09-28 NOTE — Assessment & Plan Note (Signed)
The current medical regimen is effective;  continue present plan and medications. Lab Results  Component Value Date   LDLCALC 92 06/04/2010

## 2010-09-28 NOTE — Assessment & Plan Note (Signed)
Chronic, stable - recheck H&H today with iron Lab Results  Component Value Date   WBC 9.1 06/04/2010   HGB 10.3* 06/04/2010   HCT 30.7* 06/04/2010   MCV 83.9 06/04/2010   PLT 243.0 06/04/2010

## 2010-09-28 NOTE — Patient Instructions (Signed)
It was good to see you today. Test(s) ordered today. Your results will be called to you after review (48-72hours after test completion). If any changes need to be made, you will be notified at that time. Medications reviewed, no changes at this time. temporary handicap hangtag application signed today - follow up with the [facility] and your orthopedist if permanent tag needed for second car. Please schedule followup in 4 months, call sooner if problems.

## 2010-09-28 NOTE — Progress Notes (Signed)
  Subjective:    Patient ID: Judith Shepherd, female    DOB: [birth year], 65 y.o.   MRN: [id number]  HPI Here for follow up; reviewed chronic medical issues:  DM2, uncontrolled - complicated by neuropathy and retinopathy - follows with endo ([facility]) for same - reports sugars checked  with urine dip 2x/d, no signs or symptoms hypoglycemia - reports non compliance with ongoing rx'd medical treatment and no changes in medication dose or frequency. denies adverse side effects related to current therapy.   HTN - reports compliance with ongoing medical treatment and no changes in medication dose or frequency. denies adverse side effects related to current therapy.  no HA or CP -  dyslipidemia - reports compliance with ongoing medical treatment and no changes in medication dose or frequency. denies adverse side effects related to current therapy.   CAD - no active anginal symptoms -   OA - right knee>left - occ swelling and feels weak but not wanting to pursue TKR at this time. Requests handicap tag renewal (2nd car)  Past Medical History  Diagnosis Date  . DM 08/14/2009  . DYSLIPIDEMIA 10/26/2008  . ANEMIA 02/23/2010  . CARPAL TUNNEL SYNDROME, BILATERAL 06/04/2010  . Proliferative diabetic retinopathy 02/23/2010  . HYPERTENSION 10/26/2008  . CAD 12/15/2008  . DIASTOLIC HEART FAILURE, CHRONIC 11/10/2008  . BACK PAIN, LUMBAR 11/28/2009  . Arthritis     R>L knee, declines TKR   Review of Systems  Constitutional: Positive for fatigue. Negative for unexpected weight change.  Respiratory: Negative for cough and shortness of breath.   Cardiovascular: Negative for chest pain.      Objective:   Physical Exam  Constitutional: She is oriented to person, place, and time. She appears well-developed and well-nourished. No distress.  HENT: Head: Normocephalic and atraumatic. Nose: Nose normal. Mouth/Throat: Oropharynx is clear and moist. No oropharyngeal exudate.  Eyes: Conjunctivae and EOM are normal.  Pupils are equal, round, and reactive to light. No scleral icterus.  Neck: Normal range of motion. Neck supple. No JVD present. No thyromegaly present.  Cardiovascular: Normal rate, regular rhythm and normal heart sounds.  No murmur heard. Pulmonary/Chest: Effort normal and breath sounds normal. No respiratory distress. She has no wheezes.  Psychiatric: She has a normal mood and affect. Her behavior is normal. Judgment and thought content normal.   Lab Results  Component Value Date   WBC 9.1 06/04/2010   HGB 10.3* 06/04/2010   HCT 30.7* 06/04/2010   PLT 243.0 06/04/2010   CHOL 178 06/04/2010   TRIG 112.0 06/04/2010   HDL 63.40 06/04/2010   LDLDIRECT 130.7 02/23/2010   ALT 10 02/23/2010   AST 18 02/23/2010   NA 143 02/23/2010   K 3.9 02/23/2010   CL 103 02/23/2010   CREATININE 1.1 02/23/2010   BUN 25* 02/23/2010   CO2 29 02/23/2010   TSH 0.82 02/23/2010   INR 0.9 ratio 11/23/2008   HGBA1C 9.8* 06/04/2010   MICROALBUR 4.9* 02/23/2010       Assessment & Plan:  See problem list. Medications and labs reviewed today.

## 2010-09-28 NOTE — Assessment & Plan Note (Signed)
BP Readings from Last 3 Encounters:  09/28/10 140/72  06/04/10 132/88  05/14/10 152/72   The current medical regimen is effective;  continue present plan and medications.

## 2010-09-29 ENCOUNTER — Emergency Department (HOSPITAL_COMMUNITY): Payer: Medicare Other

## 2010-09-29 ENCOUNTER — Emergency Department (HOSPITAL_COMMUNITY)
Admission: EM | Admit: 2010-09-29 | Discharge: 2010-09-29 | Discharge status: Against Medical Advice | Payer: Medicare Other | Attending: Emergency Medicine | Admitting: Emergency Medicine

## 2010-09-29 DIAGNOSIS — E119 Type 2 diabetes mellitus without complications: Secondary | ICD-10-CM | POA: Insufficient documentation

## 2010-09-29 DIAGNOSIS — N200 Calculus of kidney: Secondary | ICD-10-CM | POA: Insufficient documentation

## 2010-09-29 DIAGNOSIS — I10 Essential (primary) hypertension: Secondary | ICD-10-CM | POA: Insufficient documentation

## 2010-09-29 DIAGNOSIS — R109 Unspecified abdominal pain: Secondary | ICD-10-CM | POA: Insufficient documentation

## 2010-09-29 DIAGNOSIS — N1330 Unspecified hydronephrosis: Secondary | ICD-10-CM | POA: Insufficient documentation

## 2010-09-29 DIAGNOSIS — N289 Disorder of kidney and ureter, unspecified: Secondary | ICD-10-CM | POA: Insufficient documentation

## 2010-09-29 DIAGNOSIS — N201 Calculus of ureter: Secondary | ICD-10-CM | POA: Insufficient documentation

## 2010-09-29 LAB — CBC
Hemoglobin: 12.5 g/dL (ref 12.0–15.0)
MCH: 26.8 pg (ref 26.0–34.0)
MCV: 81.6 fL (ref 78.0–100.0)
RBC: 4.67 MIL/uL (ref 3.87–5.11)

## 2010-09-29 LAB — URINALYSIS, ROUTINE W REFLEX MICROSCOPIC
Bilirubin Urine: NEGATIVE
Glucose, UA: >1000 mg/dL — AB
Ketones, ur: 40 mg/dL — AB
Leukocytes, UA: NEGATIVE
Specific Gravity, Urine: 1.02 (ref 1.005–1.030)
pH: 6.5 (ref 5.0–8.0)

## 2010-09-29 LAB — DIFFERENTIAL
Basophils Absolute: 0 10*3/uL (ref 0.0–0.1)
Basophils Relative: 0 % (ref 0–1)
Lymphs Abs: 0.9 10*3/uL (ref 0.7–4.0)
Monocytes Absolute: 0.4 10*3/uL (ref 0.1–1.0)
Monocytes Relative: 2 % — ABNORMAL LOW (ref 3–12)
Neutrophils Relative %: 92 % — ABNORMAL HIGH (ref 43–77)

## 2010-09-29 LAB — COMPREHENSIVE METABOLIC PANEL
ALT: 16 U/L (ref 0–35)
AST: 19 U/L (ref 0–37)
Albumin: 3.8 g/dL (ref 3.5–5.2)
Alkaline Phosphatase: 109 U/L (ref 39–117)
Chloride: 93 mEq/L — ABNORMAL LOW (ref 96–112)
GFR calc Af Amer: 39 mL/min — ABNORMAL LOW (ref >60)
Potassium: 4.1 mEq/L (ref 3.5–5.1)
Sodium: 132 mEq/L — ABNORMAL LOW (ref 135–145)
Total Protein: 8.3 g/dL (ref 6.0–8.3)

## 2010-09-29 LAB — URINE MICROSCOPIC-ADD ON

## 2010-10-01 ENCOUNTER — Telehealth: Payer: Self-pay | Admitting: Internal Medicine

## 2010-10-01 LAB — URINE CULTURE: Culture  Setup Time: 201203311756

## 2010-10-01 NOTE — Telephone Encounter (Signed)
Please call patient - no change in anemia but DM uncontrolled : Lab Results  Component Value Date   HGBA1C 14.9* 09/28/2010   Ask pt to take all 3 DM meds as rx'd, send refills if needed - if already compliant with these meds, needs to see Everardo All sooner than scheduled to consider insulin or other therapy,. No medication changes recommended by me. Thanks.

## 2010-10-01 NOTE — Telephone Encounter (Signed)
Notified pt with results concerning labs. Pt states she was not taking the Januvia. Will call back to make appt with Dr. Everardo All for f/u on DM...10/01/10@ 11:22am/LMB

## 2010-10-02 ENCOUNTER — Encounter: Payer: Self-pay | Admitting: Internal Medicine

## 2010-10-03 ENCOUNTER — Ambulatory Visit (INDEPENDENT_AMBULATORY_CARE_PROVIDER_SITE_OTHER): Payer: Medicare Other | Admitting: Internal Medicine

## 2010-10-03 ENCOUNTER — Encounter: Payer: Self-pay | Admitting: Internal Medicine

## 2010-10-03 VITALS — BP 130/68 | HR 83 | Temp 98.6°F | Ht 66.0 in | Wt 188.4 lb

## 2010-10-03 DIAGNOSIS — E119 Type 2 diabetes mellitus without complications: Secondary | ICD-10-CM

## 2010-10-03 DIAGNOSIS — N200 Calculus of kidney: Secondary | ICD-10-CM

## 2010-10-03 MED ORDER — SULFAMETHOXAZOLE-TMP DS 800-160 MG PO TABS: 1.0000 | ORAL_TABLET | Freq: Two times a day (BID) | ORAL | Status: AC

## 2010-10-03 MED ORDER — SITAGLIPTIN PHOSPHATE 100 MG PO TABS: 100.0000 mg | ORAL_TABLET | Freq: Every day | ORAL | Status: DC

## 2010-10-03 NOTE — Assessment & Plan Note (Signed)
ER visit, labs and CT 09/29/10 reviewed - pt describes passing of stone No residual pain - will extend abx course to complete 10d given hydroneph on CT - erx done Stop diuretic given stone Time spent with the patient today 25 minutes, greater than 50% time spent counseling patient on diabetes, kidney stones and medication review. Also review of prior records

## 2010-10-03 NOTE — Assessment & Plan Note (Signed)
Complicated by noncompliance Follows with endo for same - complicated by noncompliance -refills done today Working on diet and exercise with weight reduction Lab Results  Component Value Date   HGBA1C 14.9* 09/28/2010   Wt Readings from Last 3 Encounters:  10/03/10 188 lb 6.4 oz (85.458 kg)  09/28/10 186 lb 1.9 oz (84.423 kg)  06/04/10 193 lb (87.544 kg)

## 2010-10-03 NOTE — Progress Notes (Signed)
  Subjective:    Patient ID: Judith Shepherd, female    DOB: 18-May-1946, 65 y.o.   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 denies adverse side effects related to current therapy.   HTN - reports compliance with ongoing medical treatment and no changes in medication dose or frequency. denies adverse side effects related to current therapy.  no HA or CP -  dyslipidemia - reports compliance with ongoing medical treatment and no changes in medication dose or frequency. denies adverse side effects related to current therapy.   CAD - no active anginal symptoms -   OA - right knee - occ swelling and feels weak but not wanting to pursue TKR at this time  Past Medical History  Diagnosis Date  . DM 08/14/2009  . DYSLIPIDEMIA 10/26/2008  . ANEMIA 02/23/2010  . CARPAL TUNNEL SYNDROME, BILATERAL 06/04/2010  . Proliferative diabetic retinopathy 02/23/2010  . HYPERTENSION 10/26/2008  . CAD 12/15/2008  . DIASTOLIC HEART FAILURE, CHRONIC 11/10/2008  . BACK PAIN, LUMBAR 11/28/2009  . Arthritis     R>L knee, declines TKR    Review of Systems  Constitutional: Negative for fever.  Respiratory: Negative for shortness of breath.   Cardiovascular: Negative for chest pain.  Genitourinary: Negative for dysuria.      Objective:   Physical Exam BP 130/68  Pulse 83  Temp(Src) 98.6 F (37 C) (Oral)  Ht 5\' 6"  (1.676 m)  Wt 188 lb 6.4 oz (85.458 kg)  BMI 30.41 kg/m2 Physical Exam  Constitutional: She is oriented to person, place, and time. She appears well-developed and well-nourished. No distress.  Eyes: Conjunctivae and EOM are normal. Pupils are equal,  round, and reactive to light. No scleral icterus.  Neck: Normal range of motion. Neck supple. No JVD present. No thyromegaly present.  Cardiovascular: Normal rate, regular rhythm and normal heart sounds.  No murmur heard. Pulmonary/Chest: Effort normal and breath sounds normal. No respiratory distress. She has no wheezes.  Abdominal: Soft. Bowel sounds are normal. She exhibits no distension. There is no tenderness.   Lab Results  Component Value Date   WBC 16.1* 09/29/2010   HGB 12.5 09/29/2010   HCT 38.1 09/29/2010   PLT 294 09/29/2010   CHOL 178 06/04/2010   TRIG 112.0 06/04/2010   HDL 63.40 06/04/2010   LDLDIRECT 130.7 02/23/2010   ALT 16 09/29/2010   AST 19 09/29/2010   NA 132* 09/29/2010   K 4.1 09/29/2010   CL 93* 09/29/2010   CREATININE 1.62* 09/29/2010   BUN 25* 09/29/2010   CO2 25 09/29/2010   TSH 0.82 02/23/2010   INR 0.9 ratio 11/23/2008   HGBA1C 14.9* 09/28/2010   MICROALBUR 4.9* 02/23/2010    Assessment & Plan:  See problem list. Medications and labs reviewed today.

## 2010-10-03 NOTE — Patient Instructions (Signed)
It was good to see you today. We have reviewed your prior records including labs and tests today. Continue antibiotics as discussed and stop diuretic - Your prescription(s) have been submitted to your pharmacy. Please take as directed and contact our office if you believe you are having problem(s) with the medication(s). Refill on medication(s) as discussed today. if your symptoms recur, continue to worsen (pain, fever, etc), or if you are unable take anything by mouth (pills, fluids, etc), you should go to the emergency room for further evaluation and treatment.

## 2010-10-09 LAB — POCT I-STAT 3, ART BLOOD GAS (G3+)
Bicarbonate: 27.6 mEq/L — ABNORMAL HIGH (ref 20.0–24.0)
O2 Saturation: 98 %
TCO2: 29 mmol/L (ref 0–100)
pCO2 arterial: 36.8 mmHg (ref 35.0–45.0)
pH, Arterial: 7.483 — ABNORMAL HIGH (ref 7.350–7.400)
pO2, Arterial: 95 mmHg (ref 80.0–100.0)

## 2010-10-09 LAB — POCT I-STAT 3, VENOUS BLOOD GAS (G3P V)
Acid-Base Excess: 2 mmol/L (ref 0.0–2.0)
Acid-Base Excess: 3 mmol/L — ABNORMAL HIGH (ref 0.0–2.0)
Acid-Base Excess: 4 mmol/L — ABNORMAL HIGH (ref 0.0–2.0)
Bicarbonate: 28.3 mEq/L — ABNORMAL HIGH (ref 20.0–24.0)
Bicarbonate: 29.3 mEq/L — ABNORMAL HIGH (ref 20.0–24.0)
O2 Saturation: 64 %
O2 Saturation: 64 %
TCO2: 31 mmol/L (ref 0–100)
pCO2, Ven: 50.1 mmHg — ABNORMAL HIGH (ref 45.0–50.0)
pH, Ven: 7.382 — ABNORMAL HIGH (ref 7.250–7.300)
pO2, Ven: 32 mmHg (ref 30.0–45.0)

## 2010-10-09 LAB — GLUCOSE, CAPILLARY
Glucose-Capillary: 186 mg/dL — ABNORMAL HIGH (ref 70–99)
Glucose-Capillary: 253 mg/dL — ABNORMAL HIGH (ref 70–99)

## 2010-10-23 ENCOUNTER — Telehealth: Payer: Self-pay | Admitting: Endocrinology

## 2010-10-23 MED ORDER — GLYBURIDE-METFORMIN 2.5-500 MG PO TABS: 1.0000 | ORAL_TABLET | Freq: Two times a day (BID) | ORAL | Status: DC

## 2010-10-23 NOTE — Telephone Encounter (Signed)
R'cd fax from [facility] for pt's Glyburide-Metformin   Last OV-06/04/2010 Last Filled-08/31/2010

## 2010-11-13 NOTE — Cardiovascular Report (Signed)
NAME:  Judith Shepherd, Judith Shepherd NO.:  [id number]   MEDICAL RECORD NO.:  [id number]          PATIENT TYPE:  OIB   LOCATION:  2899                         FACILITY:  [facility]   PHYSICIAN:  Marca Ancona, MD      DATE OF BIRTH:  07-Jan-1946   DATE OF PROCEDURE:  DATE OF DISCHARGE:  11/24/2008                            CARDIAC CATHETERIZATION   PRIMARY CARE PHYSICIAN:  Betti D. Pecola Leisure, MD   PROCEDURE:  1. Left heart catheterization.  2. Right heart catheterization.  3. Coronary angiography.  4. Left ventriculography.   INDICATIONS:  Exertional dyspnea and positive functional study in a  patient with a number of risk factors for coronary artery disease  including hypertension, diabetes, and hyperlipidemia.   PROCEDURE NOTE:  After informed consent was obtained, the right groin  was sterilely prepped and draped.  A 1% lidocaine was used to locally  anesthetize the right groin area.  The right common femoral vein was  entered using Seldinger technique, and a 7-French venous sheath was  placed.  The right common femoral artery was entered using Seldinger  technique and a 6-French arterial sheath was placed.  The right heart  catheterization was then carried using a balloon tip to Swan-Ganz  catheter.  Samples were removed for oxygen saturation from the pulmonary  artery and from the femoral artery.  Left heart catheterization was then  carried out.  The left coronary artery and right coronary artery were  engaged using the 6-French Multipurpose catheter.  The left ventricle  was entered using the 6-French Multipurpose catheter.  There were no  complications.   FINDINGS:  1. Hemodynamics:  Mean right atrial pressure 30 mmHg, RV 22/3, PA      36/13 with mean PA pressure 22 mmHg.  Mean pulmonary capillary      wedge pressure 5 mmHg. LV 155/10, aorta 146/72.  Cardiac output was      4.78 L/minute.  Cardiac index 2.44.  Mixed venous O2 saturation      64%, aorta saturation 98%.  2. Left ventriculography:  LVEF was 65%.  There were no wall motion      abnormalities in the RAO projection.  3. RCA.  The RCA was a dominant vessel.  There was a 30% distal      stenosis in the PLV branch of the RCA, otherwise no significant      disease.  4. Left main.  There was no significant left main disease.  5. Left circumflex system.  The left circumflex system was extensive.      There was a small first obtuse marginal that was diffusely      diseased.  There was a very large second obtuse marginal that      branched multiple times.  In the first branch of the second obtuse      marginal, there was a 40%-50% stenosis.  This was a moderate-sized      branch.  The continuation of the AV circumflex beyond the second      obtuse marginal provided a small to moderate-sized PLOM.  There was  a 40% stenosis in the distal AV circumflex prior to the PLOM.  6. LAD system.  The LAD itself is free from significant disease until      the distal portion was reached.  The distal LAD had about 30%      stenosis in it.  There were multiple small diagonals.  The small      first diagonal had diffuse mild disease.  There was a small second      diagonal that was totally occluded after a branch point in the mid      vessel.  There was a small third diagonal that had a 95% proximal      stenosis.   ASSESSMENT AND PLAN:  This is a 65 year old who presented with  exertional dyspnea and had a positive functional study who has diabetes,  hypertension, and hyperlipidemia.  She was found on coronary angiography  to have diffuse disease in small branch vessels.  This is consistent  with diabetes.  There was no significant stenosis in any of her major  coronaries.  She has normal LV and RV filling pressures.  We are going  to stop Lasix and resume her hydrochlorothiazide.  We will also plan on  aggressive medical treatment of hypertension, hyperlipidemia, and  diabetes to prevent progression of her  established coronary artery  disease.      Marca Ancona, MD  Electronically Signed     DM/MEDQ  D:  11/24/2008  T:  11/25/2008  Job:  [id number]   cc:   Jocelyn Lamer D. Pecola Leisure, M.D.

## 2010-11-13 NOTE — Group Therapy Note (Signed)
NAME:  Judith Shepherd, Judith Shepherd NO.:  [id number]   MEDICAL RECORD NO.:  [id number]          PATIENT TYPE:  EMS   LOCATION:  MAJO                         FACILITY:  [facility]   PHYSICIAN:  Ladell Pier, M.D.   DATE OF BIRTH:  12-07-1945                                 PROGRESS NOTE   Asked to see patient for shortness of breath.  The patient is a 65-year-  old [place] American female that presented to the emergency room with  chief complaint of shortness of breath.  The patient stated that she was  at the Urgent Care, and she was sent over from the Urgent Care to the  emergency room here at [facility].  She complains of shortness of breath and a  nonproductive cough for the past 3-4 days.  No chest pain.  Her blood  sugar was 33, and the patient was given glucagon intranasal 1 mg in the  emergency room.  I was asked to see the patient for admission for an  abnormal EKG.  The patient states that she is not staying.  She called  her primary care doctor at [facility], and she will follow-up with her  primary care tomorrow.  I did explain to the patient that I am not sure  what her baseline EKG is, but she does have T-wave inversions in the  inferior and lateral leads.  I discussed with the patient that she  should be admitted to her be ruled out for MI with her risk factors.  The patient states that she feels fine now and she will follow-up with  her primary care physician in the morning.  I discussed the risks of if  she was having a heart attack that she could have a heart attack at  home.  The patient understands.  I also discussed with her that the  insurance company may not pay for her ER visit.  She also understands  that, and still wishes to go home.  Her vital signs are temperature  97.6, blood pressure 189/96, pulse of 79, respirations 80.  Pulse ox  100% on room air.   LABORATORY DATA:  Potassium 4, chloride 104, BUN 19, creatinine 0.8, hemoglobin 12.9,  hematocrit 38.  Her  cardiac markers were negative.  Chest x-ray showed  mild cardiomegaly, no active cardiopulmonary disease.  Her D-dimer is  0.41.  The patient was to be admitted to rule out MI with serial  enzymes, 2-D echo and possibly a stress test.      Ladell Pier, M.D.  Electronically Signed     NJ/MEDQ  D:  04/25/2008  T:  04/25/2008  Job:  [id number]

## 2010-11-16 ENCOUNTER — Ambulatory Visit (INDEPENDENT_AMBULATORY_CARE_PROVIDER_SITE_OTHER): Payer: Medicare Other | Admitting: Internal Medicine

## 2010-11-16 ENCOUNTER — Ambulatory Visit (INDEPENDENT_AMBULATORY_CARE_PROVIDER_SITE_OTHER)
Admission: RE | Admit: 2010-11-16 | Discharge: 2010-11-16 | Disposition: A | Discharge status: Home / Self Care | Payer: Medicare Other | Source: Ambulatory Visit | Attending: Internal Medicine | Admitting: Internal Medicine

## 2010-11-16 ENCOUNTER — Telehealth: Payer: Self-pay | Admitting: *Deleted

## 2010-11-16 ENCOUNTER — Encounter: Payer: Self-pay | Admitting: Internal Medicine

## 2010-11-16 VITALS — BP 170/82 | HR 85 | Temp 99.0°F | Ht 66.0 in | Wt 204.0 lb

## 2010-11-16 DIAGNOSIS — I5031 Acute diastolic (congestive) heart failure: Secondary | ICD-10-CM

## 2010-11-16 DIAGNOSIS — I509 Heart failure, unspecified: Secondary | ICD-10-CM

## 2010-11-16 DIAGNOSIS — Z0000 Encounter for general adult medical examination without abnormal findings: Secondary | ICD-10-CM

## 2010-11-16 DIAGNOSIS — J019 Acute sinusitis, unspecified: Secondary | ICD-10-CM | POA: Insufficient documentation

## 2010-11-16 MED ORDER — LEVOFLOXACIN 250 MG PO TABS: 250.0000 mg | ORAL_TABLET | Freq: Every day | ORAL | Status: DC

## 2010-11-16 MED ORDER — POTASSIUM CHLORIDE 10 MEQ PO TBCR: 10.0000 meq | EXTENDED_RELEASE_TABLET | Freq: Two times a day (BID) | ORAL | Status: DC

## 2010-11-16 MED ORDER — FUROSEMIDE 40 MG PO TABS: 40.0000 mg | ORAL_TABLET | Freq: Two times a day (BID) | ORAL | Status: DC

## 2010-11-16 NOTE — Assessment & Plan Note (Addendum)
With bordeline need for hospn today which pt declines;  Will check cxr to r/o pna given the fever, but suspect mostly volume overload ;  Will check labs and cxr,  Also start lasix 40 bid (and K supp) for one wk with f/u with Dr Felicity Coyer (for exam and consider f/u labs at that time as well);  Suspect pt will need long term lasix 20-40 mg - to be determined per Dr L.; to avoid re-start thiazide diuretic given the hx of renal stones

## 2010-11-16 NOTE — Telephone Encounter (Signed)
Pt called stating that she is having SOB and is requesting a CPAP. Pt's daughter called and stated that Pt was prescribed a Rx for this same type of SOB last year-do not see in med list (Centricity/Epic). Tried to call Pt back to assess symptoms but had to [facility].

## 2010-11-16 NOTE — Op Note (Signed)
NAME:  Judith Shepherd, Judith Shepherd                      ACCOUNT NO.:  [id number]   MEDICAL RECORD NO.:  [id number]                   PATIENT TYPE:  AMB   LOCATION:  DSC                                  FACILITY:  [facility]   PHYSICIAN:  Lubertha Basque. Jerl Santos, M.D.             DATE OF BIRTH:  [birth year]   DATE OF PROCEDURE:  07/05/2003  DATE OF DISCHARGE:                                 OPERATIVE REPORT   PREOPERATIVE DIAGNOSES:  1. Left knee chondromalacia of patella.  2. Left knee torn meniscus.   POSTOPERATIVE DIAGNOSES:  1. Left knee chondromalacia of patella.  2. Left knee torn meniscus.   PROCEDURES:  1. Left knee abrasion and drilling of patellofemoral joint.  2. Left knee partial lateral meniscectomy.   ANESTHESIA:  Knee block MAC.   ATTENDING SURGEON:  Lubertha Basque. Jerl Santos, M.D.   ASSISTANT:  Lindwood Qua, P.A.   INDICATIONS FOR PROCEDURE:  The patient is a 65 year old woman with a long  history of anterior knee pain, especially on stairs.  She has persisted with  crepitations and swelling.  She had been on oral anti-inflammatories, as  well as some prednisone and declines an injection.  She has pain at rest and  pain with activities.  She is offered an arthroscopy.  Informed operative  consent was obtained after the discussion of the possible complications of  reaction to anesthesia and infection.   DESCRIPTION OF PROCEDURE:  The patient was taken to the operating suite  where knee block and MAC were applied.  She was positioned supine and  prepped and draped in normal sterile fashion.  After the administration of  IV antibiotics, an arthroscopy of the left knee was performed through two  inferior portals.  The suprapatellar pouch was benign while the  patellofemoral joint did exhibit some grade 3 change across the patella and  a dime-sized area of grade 4 change deep in the intertrochlear groove.  This  came into contact with the knee cap at about 70 degrees of flexion.  An  abrasion was performed followed by a drilling with a 0.62 K-wire.  Some  bleeding was encountered after the drilling as one would hope.  The medial  compartment showed no evidence of meniscal injury with some grade 3 change  in a focal portion of the medial tibial plateau.  The ACL appeared to be  intact.  The lateral compartment exhibited a degenerative tear of the middle  horn addressed with a 5% partial lateral meniscectomy back to stable  structures.  There were no degenerative changes in the lateral compartment.  The knee was thoroughly irrigated at the end of the case followed by  placement of Marcaine with epinephrine and morphine.  Adaptic was placed  over the portals followed by dry gauze and a loose Ace wrap.  The estimated  blood loss and intraoperative fluids can be obtained from the anesthesia  records.   DISPOSITION:  The patient  was taken to the recovery room in stable  condition.  Plans were for her to go home the same day and to follow up in  the office in less than a week.  I will contact her by phone tonight.                                               Lubertha Basque Jerl Santos, M.D.    PGD/MEDQ  D:  07/05/2003  T:  07/05/2003  Job:  [id number]

## 2010-11-16 NOTE — Patient Instructions (Addendum)
Take all new medications as prescribed - the antibiotic, lasix, and potassium Continue all other medications as before Please go to XRAY in the Basement for the x-ray test Please go to LAB in the Basement for the blood and/or urine tests to be done today Please call the phone number [phone] (the PhoneTree System) for results of testing in 2-3 days;  When calling, simply dial the number, and when prompted enter the MRN number above (the Medical Record Number) and the # key, then the message should start.

## 2010-11-16 NOTE — Telephone Encounter (Signed)
Pt came in for 4:00pm appt

## 2010-11-17 ENCOUNTER — Encounter: Payer: Self-pay | Admitting: Internal Medicine

## 2010-11-17 NOTE — Progress Notes (Signed)
Subjective:    Patient ID: Judith Shepherd, female    DOB: 06-30-1946, 65 y.o.   MRN: [id number]  HPI  Here with 3 days acute onset fever, facial pain, pressure, general weakness and malaise, and greenish d/c, with slight ST, but little to no cough and Pt denies chest pain,  Does have mild orthopnea, PND, and slight increased LE swelling, but no palpitations, dizziness or syncope, but has gradual onset wheezing, sob/doe.  Pt denies new neurological symptoms such as new headache, or facial or extremity weakness or numbness  Pt denies polydipsia, polyuria Pt states overall good compliance with meds, trying to follow lower cholesterol, diabetic diet, wt overall up from 188 to 204 after chlorthalidone stopped early April.  Overall good compliance with treatment, and good medicine tolerability.   Pt denies fever, wt loss, night sweats, loss of appetite, or other constitutional symptoms except for with current symptoms Past Medical History  Diagnosis Date  . DM 08/14/2009  . DYSLIPIDEMIA 10/26/2008  . ANEMIA 02/23/2010  . CARPAL TUNNEL SYNDROME, BILATERAL 06/04/2010  . Proliferative diabetic retinopathy 02/23/2010  . HYPERTENSION 10/26/2008  . CAD 12/15/2008  . DIASTOLIC HEART FAILURE, CHRONIC 11/10/2008  . BACK PAIN, LUMBAR 11/28/2009  . Arthritis     R>L knee, declines TKR  . Diastolic CHF, acute 11/16/2010   Past Surgical History  Procedure Date  . Abdominal hysterectomy     reports that she has never smoked. She has never used smokeless tobacco. She reports that she does not drink alcohol or use illicit drugs. family history includes Cancer in her sister; Diabetes in her mother and sisters; and Heart disease in her mother. No Known Allergies Current Outpatient Prescriptions on File Prior to Visit  Medication Sig Dispense Refill  . amLODipine-atorvastatin (CADUET) 10-80 MG per tablet Take 1 tablet by mouth every other day.        Marland Kitchen aspirin 81 MG tablet Take 81 mg by mouth daily.        .  carvedilol (COREG) 25 MG tablet Take 25 mg by mouth 2 (two) times daily.        Marland Kitchen glyBURIDE-metformin (GLUCOVANCE) 2.5-500 MG per tablet Take 1 tablet by mouth 2 (two) times daily.  60 tablet  1  . lisinopril (PRINIVIL,ZESTRIL) 40 MG tablet Take 40 mg by mouth daily.        . pioglitazone (ACTOS) 45 MG tablet Take 45 mg by mouth every morning.        . sitaGLIPtan (JANUVIA) 100 MG tablet Take 1 tablet (100 mg total) by mouth daily.  30 tablet  6  . HYDROcodone-acetaminophen (VICODIN) 5-500 MG per tablet       . ondansetron (ZOFRAN) 8 MG tablet       . Tamsulosin HCl (FLOMAX) 0.4 MG CAPS       . traMADol (ULTRAM) 50 MG tablet Take 50 mg by mouth every 6 (six) hours as needed.         Review of Systems Review of Systems  Constitutional: Negative for diaphoresis and unexpected weight change.  HENT: Negative for drooling and tinnitus.   Eyes: Negative for photophobia and visual disturbance.  Respiratory: Negative for choking and stridor.   Gastrointestinal: Negative for vomiting and blood in stool.  Genitourinary: Negative for hematuria and decreased urine volume.  Musculoskeletal: Negative for gait problem.  Skin: Negative for color change and wound.  Neurological: Negative for tremors and numbness.  Psychiatric/Behavioral: Negative for decreased concentration. The patient is not hyperactive.  Objective:   Physical Exam BP 170/82  Pulse 85  Temp(Src) 99 F (37.2 C) (Oral)  Ht 5\' 6"  (1.676 m)  Wt 204 lb (92.534 kg)  BMI 32.93 kg/m2  SpO2 93% Physical Exam  VS noted Constitutional: Pt appears well-developed and well-nourished.  HENT: Head: Normocephalic.  Right Ear: External ear normal.  Bilat tm's mild erythema.  Sinus tender.  Pharynx mild erythema Left Ear: External ear normal. Bilat tm's mild erythema.  Sinus nontender.  Pharynx mild erythema Eyes: Conjunctivae and EOM are normal. Pupils are equal, round, and reactive to light.  Neck: Normal range of motion. Neck  supple.  Cardiovascular: Normal rate and regular rhythm.   Pulmonary/Chest: Effort normal and breath sounds with bibas rales, trace wheeze bilat Abd:  Soft, NT, non-distended, + BS Neurological: Pt is alert. No cranial nerve deficit.  Skin: Skin is warm. No erythema.  Psychiatric: Pt behavior is normal. Thought content normal.  Bilat trace to 1+ edema       Assessment & Plan:

## 2010-11-17 NOTE — Assessment & Plan Note (Signed)
Mild to mod, for antibx course,  to f/u any worsening symptoms or concerns 

## 2010-11-22 ENCOUNTER — Other Ambulatory Visit (INDEPENDENT_AMBULATORY_CARE_PROVIDER_SITE_OTHER): Payer: Medicare Other

## 2010-11-22 ENCOUNTER — Ambulatory Visit (INDEPENDENT_AMBULATORY_CARE_PROVIDER_SITE_OTHER): Payer: Medicare Other | Admitting: Internal Medicine

## 2010-11-22 ENCOUNTER — Other Ambulatory Visit (INDEPENDENT_AMBULATORY_CARE_PROVIDER_SITE_OTHER): Payer: Medicare Other | Admitting: Internal Medicine

## 2010-11-22 ENCOUNTER — Encounter: Payer: Self-pay | Admitting: Internal Medicine

## 2010-11-22 ENCOUNTER — Telehealth: Payer: Self-pay | Admitting: Internal Medicine

## 2010-11-22 VITALS — BP 158/82 | HR 90 | Temp 98.5°F | Ht 66.0 in | Wt 194.4 lb

## 2010-11-22 DIAGNOSIS — I5031 Acute diastolic (congestive) heart failure: Secondary | ICD-10-CM

## 2010-11-22 DIAGNOSIS — I5033 Acute on chronic diastolic (congestive) heart failure: Secondary | ICD-10-CM

## 2010-11-22 DIAGNOSIS — Z862 Personal history of diseases of the blood and blood-forming organs and certain disorders involving the immune mechanism: Secondary | ICD-10-CM

## 2010-11-22 DIAGNOSIS — I509 Heart failure, unspecified: Secondary | ICD-10-CM

## 2010-11-22 DIAGNOSIS — R300 Dysuria: Secondary | ICD-10-CM

## 2010-11-22 DIAGNOSIS — Z79899 Other long term (current) drug therapy: Secondary | ICD-10-CM

## 2010-11-22 DIAGNOSIS — Z8639 Personal history of other endocrine, nutritional and metabolic disease: Secondary | ICD-10-CM

## 2010-11-22 DIAGNOSIS — R0609 Other forms of dyspnea: Secondary | ICD-10-CM

## 2010-11-22 DIAGNOSIS — D649 Anemia, unspecified: Secondary | ICD-10-CM

## 2010-11-22 DIAGNOSIS — D539 Nutritional anemia, unspecified: Secondary | ICD-10-CM

## 2010-11-22 DIAGNOSIS — E119 Type 2 diabetes mellitus without complications: Secondary | ICD-10-CM

## 2010-11-22 LAB — HEPATIC FUNCTION PANEL
ALT: 29 U/L (ref 0–35)
AST: 26 U/L (ref 0–37)
Bilirubin, Direct: 0.1 mg/dL (ref 0.0–0.3)
Total Bilirubin: 0.3 mg/dL (ref 0.3–1.2)

## 2010-11-22 LAB — BASIC METABOLIC PANEL
BUN: 22 mg/dL (ref 6–23)
BUN: 22 mg/dL (ref 6–23)
CO2: 30 mEq/L (ref 19–32)
Chloride: 96 mEq/L (ref 96–112)
Creatinine, Ser: 1.3 mg/dL — ABNORMAL HIGH (ref 0.4–1.2)
Glucose, Bld: 511 mg/dL (ref 70–99)
Glucose, Bld: 512 mg/dL (ref 70–99)
Potassium: 4 mEq/L (ref 3.5–5.1)
Potassium: 4 mEq/L (ref 3.5–5.1)
Sodium: 137 mEq/L (ref 135–145)

## 2010-11-22 LAB — CBC WITH DIFFERENTIAL/PLATELET
Basophils Absolute: 0 10*3/uL (ref 0.0–0.1)
Eosinophils Relative: 0.9 % (ref 0.0–5.0)
HCT: 29.8 % — ABNORMAL LOW (ref 36.0–46.0)
Lymphs Abs: 1.2 10*3/uL (ref 0.7–4.0)
Monocytes Relative: 5.7 % (ref 3.0–12.0)
Neutrophils Relative %: 79.9 % — ABNORMAL HIGH (ref 43.0–77.0)
Platelets: 300 10*3/uL (ref 150.0–400.0)
RDW: 13.8 % (ref 11.5–14.6)
WBC: 9.2 10*3/uL (ref 4.5–10.5)

## 2010-11-22 LAB — URINALYSIS, ROUTINE W REFLEX MICROSCOPIC
Bilirubin Urine: NEGATIVE
Ketones, ur: NEGATIVE
Total Protein, Urine: NEGATIVE
Urine Glucose: >=1000
pH: 5.5 (ref 5.0–8.0)

## 2010-11-22 NOTE — Telephone Encounter (Signed)
Please ask pt if she has been taking all her medications, including the glucovance and januvia (and if not, why - ? Cost)  If she has glucovance, please 2 EXTRA pills now, (she normally take 1 bid per chart) for total 4 pills today  Please ask if pt is having fever , pain or other new symtpoms, though she just saw Dr Rowe Pavy earilier today, and did not at that time  If pt does not have DM pills (maybe out of meds for whatever reason) she will need to return to office ASAP for insulin shot and to determine if further med changes need to be made

## 2010-11-22 NOTE — Telephone Encounter (Signed)
Sorry for the confusion, she should only take her meds as previously prescribed for now, and needs to f/u with Dr Felicity Coyer next available  Please addon to lab:   hgba1c   250.02  Sounds like pt may need new glucometer and/or strips

## 2010-11-22 NOTE — Telephone Encounter (Signed)
Message copied by Corwin Levins on Thu Nov 22, 2010  2:39 PM ------      Message from: Scharlene Gloss      Created: Thu Nov 22, 2010  2:20 PM      Regarding: Glucose critical       The lab called with a critical glucose of 511.

## 2010-11-22 NOTE — Telephone Encounter (Signed)
Called the patient and she has Januvia and will take immediately. Also does have enough Glucovance, but will run out soon if needs to continue 4 times per day. She agreed to check her blood sugar, BUT has not checked in while and unsure of supplies. Informed the pt. Of MD's instructions , pt. Agreed to all.

## 2010-11-22 NOTE — Progress Notes (Signed)
Subjective:    Patient ID: Judith Shepherd, female    DOB: 04-02-1946, 65 y.o.   MRN: [id number]  HPI  Here for follow up - seen 5/18 by my partner [facility]) for CHF acute exac in setting of sinus infx - CXR with edema, no PNA dyspnea on exertion and edema improved with addition of lasix 40 bid and abx (last day today) Also 4/4/ ER visit for kidney stone on left side - continued dysuria No fever, no chest pain and no cough.  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 denies adverse side effects related to current therapy.   HTN - reports compliance with ongoing medical treatment and no changes in medication dose or frequency. denies adverse side effects related to current therapy.  no HA or CP -  dyslipidemia - reports compliance with ongoing medical treatment and no changes in medication dose or frequency. denies adverse side effects related to current therapy.   CAD - no active anginal symptoms -   OA - right knee - occ swelling and feels weak but does not want to pursue TKR   Past Medical History  Diagnosis Date  . Proliferative diabetic retinopathy   . CAD 10/2008    LHC/RHC with diffuse small branch CAD  . DIASTOLIC HEART FAILURE, CHRONIC     echo 10/2008  . Arthritis     R>L knee, declines TKR  . Diastolic CHF, acute 11/16/2010  . ANEMIA   . DM   . DYSLIPIDEMIA   . HYPERTENSION   . CARPAL TUNNEL SYNDROME, BILATERAL     Review of Systems  Constitutional: Negative for fever.  Respiratory: Negative for shortness of breath.   Cardiovascular: Negative for chest pain.  Genitourinary: Negative for dysuria.      Objective:   Physical Exam BP 158/82  Pulse 90   Temp(Src) 98.5 F (36.9 C) (Oral)  Ht 5\' 6"  (1.676 m)  Wt 194 lb 6.4 oz (88.179 kg)  BMI 31.38 kg/m2  SpO2 95% Physical Exam  Constitutional: She is oriented to person, place, and time. She appears well-developed and well-nourished. No distress.  Cardiovascular: Normal rate, regular rhythm and normal heart sounds.  No murmur heard. Trace BLE edema (improved) Pulmonary/Chest: Effort normal and breath sounds normal. No respiratory distress. She has no wheezes.  Wt Readings from Last 3 Encounters:  11/22/10 194 lb 6.4 oz (88.179 kg)  11/16/10 204 lb (92.534 kg)  10/03/10 188 lb 6.4 oz (85.458 kg)     Lab Results  Component Value Date   WBC 9.2 11/22/2010   HGB 9.8* 11/22/2010   HCT 29.8* 11/22/2010   PLT 300.0 11/22/2010   CHOL 178 06/04/2010   TRIG 112.0 06/04/2010   HDL 63.40 06/04/2010   LDLDIRECT 130.7 02/23/2010   ALT 29 11/22/2010   AST 26 11/22/2010   NA 137 11/22/2010   NA 137 11/22/2010   K 4.0 11/22/2010   K 4.0 11/22/2010   CL 97 11/22/2010   CL 96 11/22/2010   CREATININE 1.3* 11/22/2010   CREATININE 1.3* 11/22/2010   BUN 22 11/22/2010   BUN 22 11/22/2010   CO2 30 11/22/2010   CO2 29 11/22/2010   TSH 0.82 02/23/2010  INR 0.9 ratio 11/23/2008   HGBA1C 14.9* 09/28/2010   MICROALBUR 4.9* 02/23/2010    Assessment & Plan:  See problem list. Medications and labs reviewed today. A on C diastolic HF - improved with diuresis - decreased swelling, improved dyspnea and weight down almost 10# - recheck labs now, then reduce to Lasix 40mg  qd to maintain volume balance  Dysuria, L kidney stone on CT 09/2010 - check UA now, tx abx if evidence infx (just completed course for sinus symptoms - will not extend at this time)  Addendum: - labs reviewed - significant drop in Hgb on CBC - known hx anemia - B12/iron pending - HD stable and no hx for GIB  Uncontrolled DM2 - encouraged medication compliance - increase in glucovance as per phone note today and continue Januvia-- will leave off Actos due to  CHF exac- no HONK symptoms today

## 2010-11-22 NOTE — Patient Instructions (Addendum)
It was good to see you today. Your fluid is much better! Reduce dose of furosemide and potassium pill as discussed - Test(s) ordered today. Your results will be called to you after review (48-72hours after test completion). If any changes need to be made, you will be notified at that time. Please keep scheduled follow up as planned, call sooner if problems.

## 2010-11-22 NOTE — Telephone Encounter (Signed)
D/w robin;  Pt has [place], admits to not taking every day  Please call pt back;  Needs to take the glucovance as you indicated, but also take the [place] today as well (and everyday);  Please provide new rx if needed for refills  Then check sugar q 6 hrs as best she can tonight and tomorrow (before meals and before bedtime) and call tomorrow after the noontime sugar to let Dr Felicity Coyer know her results  She may need OV if this cannot be improved over the phone

## 2010-11-22 NOTE — Telephone Encounter (Signed)
Called the patient and she has only taken the Glucovance and not Januvia. Informed to take 2 more glucovance asap when she got off the phone. She agreed to do so.  She stated she is almost out and would need a new prescription if to continue 4 per day. She has no new symptoms at this time.

## 2010-11-22 NOTE — Assessment & Plan Note (Signed)
Chronic, but decrease noted since 08/2010 12/2009 colo with hemorrhoids, otherwise normal -  Lab Results  Component Value Date   WBC 9.2 11/22/2010   HGB 9.8* 11/22/2010   HCT 29.8* 11/22/2010   MCV 82.8 11/22/2010   PLT 300.0 11/22/2010

## 2010-11-23 LAB — IBC PANEL
Iron: 37 ug/dL — ABNORMAL LOW (ref 42–145)
Saturation Ratios: 10.7 % — ABNORMAL LOW (ref 20.0–50.0)
Transferrin: 247.1 mg/dL (ref 212.0–360.0)

## 2010-11-23 LAB — B12 AND FOLATE PANEL: Vitamin B-12: 622 pg/mL (ref 211–911)

## 2010-11-23 LAB — HEMOGLOBIN A1C: Hgb A1c MFr Bld: 13.6 % — ABNORMAL HIGH (ref 4.6–6.5)

## 2010-11-23 NOTE — Telephone Encounter (Signed)
Called pt. Informed to schedule next available with Dr. Felicity Coyer , patient agreed to do so. Also sent addon to the lab. Informed of MD's instructions on medications.

## 2010-11-27 ENCOUNTER — Encounter: Payer: Self-pay | Admitting: Internal Medicine

## 2010-11-27 ENCOUNTER — Ambulatory Visit (INDEPENDENT_AMBULATORY_CARE_PROVIDER_SITE_OTHER): Payer: Medicare Other | Admitting: Internal Medicine

## 2010-11-27 ENCOUNTER — Other Ambulatory Visit (INDEPENDENT_AMBULATORY_CARE_PROVIDER_SITE_OTHER): Payer: Medicare Other

## 2010-11-27 DIAGNOSIS — D649 Anemia, unspecified: Secondary | ICD-10-CM

## 2010-11-27 LAB — CBC WITH DIFFERENTIAL/PLATELET
Basophils Absolute: 0 10*3/uL (ref 0.0–0.1)
Eosinophils Absolute: 0.1 10*3/uL (ref 0.0–0.7)
Hemoglobin: 10.1 g/dL — ABNORMAL LOW (ref 12.0–15.0)
Lymphocytes Relative: 12.7 % (ref 12.0–46.0)
MCHC: 32.9 g/dL (ref 30.0–36.0)
Neutro Abs: 7 10*3/uL (ref 1.4–7.7)
Neutrophils Relative %: 80.2 % — ABNORMAL HIGH (ref 43.0–77.0)
RDW: 14 % (ref 11.5–14.6)

## 2010-11-27 MED ORDER — GLYBURIDE-METFORMIN 2.5-500 MG PO TABS: 2.0000 | ORAL_TABLET | Freq: Two times a day (BID) | ORAL | Status: DC

## 2010-11-27 NOTE — Assessment & Plan Note (Signed)
Chronic, but decrease noted since 08/2010 12/2009 colo with hemorrhoids, otherwise normal -  Recheck CBC and ferritin now -  Lab Results  Component Value Date   WBC 9.2 11/22/2010   HGB 9.8* 11/22/2010   HCT 29.8* 11/22/2010   MCV 82.8 11/22/2010   PLT 300.0 11/22/2010

## 2010-11-27 NOTE — Patient Instructions (Signed)
It was good to see you today. Test(s) ordered today. Your results will be called to you after review (48-72hours after test completion). If any changes need to be made, you will be notified at that time. Increase glucovance to 2 pills AM and 2 pills PM - also continue Januvia once daily for diabetes -  Check sugars 2x/day and call if over 250 for other medication adjusments Keep 6 week follow up as previously scheduled, call sooner if problems

## 2010-11-27 NOTE — Progress Notes (Signed)
Subjective:    Patient ID: Judith Shepherd, female    DOB: 1946-01-10, 65 y.o.   MRN: [id number]  HPI  Here for follow up -  seen 5/25 by me  for CHF follow up - vol status improved but labs showed uncontrolled DM and progressive anemia - Denies BRBPR or epigastric pain - colo 03/2010 unremarkable  seen 5/18 by my partner [facility]) for CHF acute exac in setting of sinus infx - CXR with edema, no PNA dyspnea on exertion and edema improved with addition of lasix 40 bid and abx (last day today)  Also 4/4/ ER visit for kidney stone on left side - continued dysuria No fever, no chest pain and no cough.  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   HTN - reports compliance with ongoing medical treatment and no changes in medication dose or frequency. denies adverse side effects related to current therapy.  no HA or CP -  dyslipidemia - reports compliance with ongoing medical treatment and no changes in medication dose or frequency. denies adverse side effects related to current therapy.     Past Medical History  Diagnosis Date  . Proliferative diabetic retinopathy   . CAD 10/2008    LHC/RHC with diffuse small branch CAD  . DIASTOLIC HEART FAILURE, CHRONIC     echo 10/2008  . Arthritis     R>L knee, declines TKR  . Diastolic CHF, acute 11/16/2010  . ANEMIA   . DM   . DYSLIPIDEMIA   . HYPERTENSION   . CARPAL TUNNEL SYNDROME, BILATERAL     Review of Systems  Constitutional: Negative for fever.  Respiratory: Negative for shortness of breath.   Cardiovascular: Negative for chest pain.  Genitourinary: Negative for dysuria.      Objective:   Physical Exam BP 120/62  Pulse 80   Temp(Src) 98.2 F (36.8 C) (Oral)  Ht 5\' 6"  (1.676 m)  Wt 192 lb 6.4 oz (87.272 kg)  BMI 31.05 kg/m2  SpO2 94% Physical Exam  Constitutional: She is oriented to person, place, and time. She appears well-developed and well-nourished. No distress.  Cardiovascular: Normal rate, regular rhythm and normal heart sounds.  No murmur heard. No BLE edema Pulmonary/Chest: Effort normal and breath sounds normal. No respiratory distress. She has no wheezes.  Wt Readings from Last 3 Encounters:  11/27/10 192 lb 6.4 oz (87.272 kg)  11/22/10 194 lb 6.4 oz (88.179 kg)  11/16/10 204 lb (92.534 kg)     Lab Results  Component Value Date   WBC 9.2 11/22/2010   HGB 9.8* 11/22/2010   HCT 29.8* 11/22/2010   PLT 300.0 11/22/2010   CHOL 178 06/04/2010   TRIG 112.0 06/04/2010   HDL 63.40 06/04/2010   LDLDIRECT 130.7 02/23/2010   ALT 29 11/22/2010   AST 26 11/22/2010   NA 137 11/22/2010   NA 137 11/22/2010   K 4.0 11/22/2010   K 4.0 11/22/2010   CL 97 11/22/2010   CL 96 11/22/2010   CREATININE 1.3* 11/22/2010   CREATININE 1.3* 11/22/2010   BUN 22 11/22/2010   BUN 22 11/22/2010   CO2 30 11/22/2010   CO2 29 11/22/2010   TSH 0.82 02/23/2010   INR 0.9  ratio 11/23/2008   HGBA1C 13.6* 11/22/2010   MICROALBUR 4.9* 02/23/2010   Lab Results  Component Value Date   FERRITIN 16.4 09/28/2010     Assessment & Plan:  See problem list. Medications and labs reviewed today.  Uncontrolled DM2 - increase glyburide/metformin dose and continue [place] - reviewed consquences of med noncompliance and uncontrolled DM - pt with limited acceptance of information  Anemia - recheck now with ferritin level - no hx or exam for ongoing blood loss - may need repeat upper GI (lower done 12/2009, hung - reviewed)  Chronic diastolic HF - euvolemic - continue same diuresis - decreased swelling, improved dyspnea and weight down> 10# since exac 10/2010 - recheck labs now, then reduce to Lasix 40mg  qd to maintain volume balance  hx L kidney stone on  CT 09/2010 - resolved

## 2010-12-03 ENCOUNTER — Ambulatory Visit: Payer: Self-pay | Admitting: Endocrinology

## 2010-12-23 ENCOUNTER — Other Ambulatory Visit: Payer: Self-pay | Admitting: Endocrinology

## 2011-01-14 ENCOUNTER — Ambulatory Visit (INDEPENDENT_AMBULATORY_CARE_PROVIDER_SITE_OTHER): Payer: Medicare Other | Admitting: Internal Medicine

## 2011-01-14 ENCOUNTER — Encounter: Payer: Self-pay | Admitting: Internal Medicine

## 2011-01-14 DIAGNOSIS — I5031 Acute diastolic (congestive) heart failure: Secondary | ICD-10-CM

## 2011-01-14 DIAGNOSIS — J3089 Other allergic rhinitis: Secondary | ICD-10-CM

## 2011-01-14 DIAGNOSIS — I509 Heart failure, unspecified: Secondary | ICD-10-CM

## 2011-01-14 MED ORDER — LORATADINE 10 MG PO TABS: 10.0000 mg | ORAL_TABLET | Freq: Every day | ORAL | Status: DC | PRN

## 2011-01-14 NOTE — Progress Notes (Signed)
  Subjective:    Patient ID: Judith Shepherd, female    DOB: 13-Mar-1946, 65 y.o.   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   HTN - reports compliance with ongoing medical treatment and no changes in medication dose or frequency. denies adverse side effects related to current therapy.  no HA or CP -  dyslipidemia - reports compliance with ongoing medical treatment and no changes in medication dose or frequency. denies adverse side effects related to current therapy.   dCHF follow up - 10/2010 exac - appears euvolemic but slight weight increase noted; denies dyspnea on exertion or edema change  Anemia, chronic dz - Denies BRBPR or epigastric pain - colo 03/2010 unremarkable    Past Medical History  Diagnosis Date  . Proliferative diabetic retinopathy   . CAD 10/2008    LHC/RHC with diffuse small branch CAD  . DIASTOLIC HEART FAILURE, CHRONIC     echo 10/2008  . Arthritis     R>L knee, declines TKR  . ANEMIA   . DM   . DYSLIPIDEMIA   . HYPERTENSION   . CARPAL TUNNEL SYNDROME, BILATERAL     Review of Systems  Constitutional: Negative for fever.  Respiratory: Negative for shortness of breath.   Cardiovascular: Negative for chest pain.  Genitourinary: Negative for dysuria.      Objective:   Physical Exam  BP 142/76  Pulse 71  Temp(Src) 98.9 F (37.2 C) (Oral)  Ht 5\' 6"  (1.676 m)  Wt 197 lb 12.8 oz (89.721 kg)  BMI 31.93 kg/m2  SpO2 96% Physical Exam  Constitutional: She is oriented to person, place, and time. She appears well-developed and well-nourished. No distress.  Cardiovascular: Normal rate,  regular rhythm and normal heart sounds.  No murmur heard. trace BLE edema Pulmonary/Chest: Effort normal and breath sounds normal. No respiratory distress. She has no wheezes.   Wt Readings from Last 3 Encounters:  01/14/11 197 lb 12.8 oz (89.721 kg)  11/27/10 192 lb 6.4 oz (87.272 kg)  11/22/10 194 lb 6.4 oz (88.179 kg)   Lab Results  Component Value Date   HGBA1C 13.6* 11/22/2010    Lab Results  Component Value Date   FERRITIN 22.8 11/27/2010     Assessment & Plan:  See problem list. Medications and labs reviewed today.  Uncontrolled DM2 - again, pleaded for med compliance - reviewed consquences of med noncompliance and uncontrolled DM - pt with limited acceptance of information, several med alternatives suggested and pt declines all "i'll do it my way, not with medications" - reminded exercise and diet unlikely to provided adequate control of DM but encouraged to comply with plans for same as well as meds  Chronic diastolic HF - euvolemic but weight/edema trending up - resume bid diuresis - improved dyspnea - erx Lasix 40mg  bid to maintain volume balance

## 2011-01-14 NOTE — Patient Instructions (Signed)
It was good to see you today. Increase Lasix to 40mg  2x/day and please take glucovance 2 pills AM and 2 pills PM - also continue Januvia once daily for diabetes -  Other Medications reviewed, take claritin for allergies and sinus symptoms - Your prescription(s) have been submitted to your pharmacy. Please take as directed and contact our office if you believe you are having problem(s) with the medication(s). Check sugars 2x/day and call if over 250 for other medication adjustments Please schedule followup in 3-4 months, call sooner if problems.

## 2011-01-24 ENCOUNTER — Other Ambulatory Visit: Payer: Self-pay | Admitting: Cardiology

## 2011-01-28 ENCOUNTER — Ambulatory Visit: Payer: Medicare Other | Admitting: Internal Medicine

## 2011-01-28 ENCOUNTER — Ambulatory Visit: Payer: Self-pay | Admitting: Endocrinology

## 2011-02-18 ENCOUNTER — Ambulatory Visit: Payer: Medicare Other | Admitting: Internal Medicine

## 2011-03-05 ENCOUNTER — Ambulatory Visit (INDEPENDENT_AMBULATORY_CARE_PROVIDER_SITE_OTHER): Payer: Medicare Other | Admitting: Internal Medicine

## 2011-03-05 ENCOUNTER — Other Ambulatory Visit (INDEPENDENT_AMBULATORY_CARE_PROVIDER_SITE_OTHER): Payer: Medicare Other

## 2011-03-05 ENCOUNTER — Encounter: Payer: Self-pay | Admitting: Internal Medicine

## 2011-03-05 VITALS — BP 164/88 | HR 78 | Temp 99.8°F | Ht 66.0 in | Wt 194.8 lb

## 2011-03-05 DIAGNOSIS — N390 Urinary tract infection, site not specified: Secondary | ICD-10-CM

## 2011-03-05 DIAGNOSIS — E119 Type 2 diabetes mellitus without complications: Secondary | ICD-10-CM

## 2011-03-05 DIAGNOSIS — R1011 Right upper quadrant pain: Secondary | ICD-10-CM

## 2011-03-05 DIAGNOSIS — J309 Allergic rhinitis, unspecified: Secondary | ICD-10-CM

## 2011-03-05 LAB — POCT URINALYSIS DIPSTICK
Protein, UA: NEGATIVE
Spec Grav, UA: 1.002
Urobilinogen, UA: 0.2

## 2011-03-05 LAB — CBC WITH DIFFERENTIAL/PLATELET
Basophils Relative: 0.4 % (ref 0.0–3.0)
Eosinophils Absolute: 0.1 10*3/uL (ref 0.0–0.7)
Eosinophils Relative: 1.3 % (ref 0.0–5.0)
Hemoglobin: 9.7 g/dL — ABNORMAL LOW (ref 12.0–15.0)
Lymphocytes Relative: 18.7 % (ref 12.0–46.0)
Monocytes Relative: 6.4 % (ref 3.0–12.0)
Neutro Abs: 6 10*3/uL (ref 1.4–7.7)
Neutrophils Relative %: 73.2 % (ref 43.0–77.0)
RBC: 3.59 Mil/uL — ABNORMAL LOW (ref 3.87–5.11)
WBC: 8.2 10*3/uL (ref 4.5–10.5)

## 2011-03-05 LAB — BASIC METABOLIC PANEL
BUN: 26 mg/dL — ABNORMAL HIGH (ref 6–23)
Chloride: 101 mEq/L (ref 96–112)
Potassium: 3.6 mEq/L (ref 3.5–5.1)
Sodium: 137 mEq/L (ref 135–145)

## 2011-03-05 LAB — HEMOGLOBIN A1C: Hgb A1c MFr Bld: 11.8 % — ABNORMAL HIGH (ref 4.6–6.5)

## 2011-03-05 LAB — HEPATIC FUNCTION PANEL
Bilirubin, Direct: 0 mg/dL (ref 0.0–0.3)
Total Protein: 6.8 g/dL (ref 6.0–8.3)

## 2011-03-05 MED ORDER — CIPROFLOXACIN HCL 500 MG PO TABS: 500.0000 mg | ORAL_TABLET | Freq: Two times a day (BID) | ORAL | Status: AC

## 2011-03-05 MED ORDER — FLUTICASONE PROPIONATE 50 MCG/ACT NA SUSP: 2.0000 | Freq: Every day | NASAL | Status: DC

## 2011-03-05 NOTE — Patient Instructions (Signed)
It was good to see you today. Cipro antibiotics and Flonase nose spray - Your prescription(s) have been submitted to your pharmacy. Please take as directed and contact our office if you believe you are having problem(s) with the medication(s). Test(s) ordered today. Your results will be called to you after review (48-72hours after test completion). If any changes need to be made, you will be notified at that time.

## 2011-03-05 NOTE — Assessment & Plan Note (Addendum)
Complicated by noncompliance Uncontrolled DM2 - again, pleaded for med compliance - reviewed consquences of med noncompliance and uncontrolled DM -  pt with limited acceptance of information reminded exercise and diet unlikely to provided adequate medical control of DM but encouraged to comply with plans for same in addition to meds  Lab Results  Component Value Date   HGBA1C 13.6* 11/22/2010   Wt Readings from Last 3 Encounters:  03/05/11 194 lb 12.8 oz (88.361 kg)  01/14/11 197 lb 12.8 oz (89.721 kg)  11/27/10 192 lb 6.4 oz (87.272 kg)

## 2011-03-05 NOTE — Progress Notes (Signed)
Subjective:    Patient ID: Judith Shepherd, female    DOB: 1946/06/19, 65 y.o.   MRN: [id number]  HPI  Here for cramping in right side associated with nausea Onset 4 days ago No fever or vomit symptoms, denies abdominal pain or change in BM  Also ?UTI: freq small vol voiding, no hematuria or flank pain. 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   HTN - reports compliance with ongoing medical treatment and no changes in medication dose or frequency. denies adverse side effects related to current therapy.  no HA or CP -  dyslipidemia - reports compliance with ongoing medical treatment and no changes in medication dose or frequency. denies adverse side effects related to current therapy.   dCHF follow up - 10/2010 exac - appears euvolemic; denies dyspnea on exertion or edema change  Anemia, chronic dz - Denies BRBPR or epigastric pain - colo 03/2010 unremarkable    Past Medical History  Diagnosis Date  . Proliferative diabetic retinopathy   . CAD 10/2008    LHC/RHC with diffuse small branch CAD  . DIASTOLIC HEART FAILURE, CHRONIC     echo 10/2008  . Arthritis     R>L knee, declines TKR  . ANEMIA   . DM   . DYSLIPIDEMIA   . HYPERTENSION   . CARPAL TUNNEL SYNDROME, BILATERAL     Review of Systems  Constitutional: Negative for fever.  Respiratory: Negative for shortness of breath.   Cardiovascular: Negative for chest pain.  Genitourinary: Negative for dysuria.      Objective:   Physical Exam  BP 164/88  Pulse 78  Temp(Src) 99.8 F (37.7 C) (Oral)  Ht 5'  6" (1.676 m)  Wt 194 lb 12.8 oz (88.361 kg)  BMI 31.44 kg/m2  SpO2 96%  Constitutional: She is oriented to person, place, and time. She appears well-developed and well-nourished. No distress.  Cardiovascular: Normal rate, regular rhythm and normal heart sounds.  No murmur heard. trace BLE edema Pulmonary/Chest: Effort normal and breath sounds normal. No respiratory distress. She has no wheezes.  Abd: SNT, +BS, no rebound, no mass  Wt Readings from Last 3 Encounters:  03/05/11 194 lb 12.8 oz (88.361 kg)  01/14/11 197 lb 12.8 oz (89.721 kg)  11/27/10 192 lb 6.4 oz (87.272 kg)     Lab Results  Component Value Date   WBC 8.8 11/27/2010   HGB 10.1* 11/27/2010   HCT 30.6* 11/27/2010   PLT 293.0 11/27/2010   CHOL 178 06/04/2010   TRIG 112.0 06/04/2010   HDL 63.40 06/04/2010   LDLDIRECT 130.7 02/23/2010   ALT 29 11/22/2010   AST 26 11/22/2010   NA 137 11/22/2010   NA 137 11/22/2010   K 4.0 11/22/2010   K 4.0 11/22/2010   CL 97 11/22/2010   CL 96 11/22/2010   CREATININE 1.3* 11/22/2010   CREATININE 1.3* 11/22/2010   BUN 22 11/22/2010   BUN 22 11/22/2010   CO2 30 11/22/2010   CO2 29 11/22/2010   TSH 0.82 02/23/2010   INR 0.9 ratio 11/23/2008   HGBA1C 13.6* 11/22/2010   MICROALBUR 4.9*  02/23/2010     Assessment & Plan:  See problem list. Medications and labs reviewed today.  RUQ pain with cramping and nausea - check labs, exam benign but LGF - check UCx and tx for mild UTI, continue phenergan prn and consider imaging if unimproved  Allg rhinitis/sinusitus - add flonase - erx done

## 2011-03-07 LAB — URINE CULTURE: Colony Count: 60000

## 2011-03-25 ENCOUNTER — Other Ambulatory Visit: Payer: Self-pay | Admitting: Cardiology

## 2011-04-01 LAB — POCT I-STAT, CHEM 8
Chloride: 104
Creatinine, Ser: 0.8
Glucose, Bld: 239 — ABNORMAL HIGH
Hemoglobin: 12.9
Potassium: 4
Sodium: 140

## 2011-04-01 LAB — POCT CARDIAC MARKERS
CKMB, poc: 1.7
Myoglobin, poc: 43.9
Troponin i, poc: <0.05
Troponin i, poc: <0.05

## 2011-04-03 ENCOUNTER — Telehealth: Payer: Self-pay | Admitting: *Deleted

## 2011-04-03 NOTE — Telephone Encounter (Signed)
Left msg on vm requesting to speak with nurse have some forms that need to be completed. Called pt back & ask her what type of forms she stated it was for her license. They needed to know how her blood sugars doing. Told pt to drop form off just saw md on 03/05/11 if she need appt we will call her back after md review....04/03/11@12 :11pm/LMB

## 2011-04-10 DIAGNOSIS — Z0279 Encounter for issue of other medical certificate: Secondary | ICD-10-CM

## 2011-04-30 ENCOUNTER — Ambulatory Visit: Payer: Medicare Other

## 2011-05-13 ENCOUNTER — Ambulatory Visit (INDEPENDENT_AMBULATORY_CARE_PROVIDER_SITE_OTHER): Payer: Medicare Other | Admitting: Internal Medicine

## 2011-05-13 ENCOUNTER — Encounter: Payer: Self-pay | Admitting: Internal Medicine

## 2011-05-13 ENCOUNTER — Other Ambulatory Visit (INDEPENDENT_AMBULATORY_CARE_PROVIDER_SITE_OTHER): Payer: Medicare Other

## 2011-05-13 VITALS — BP 162/88 | HR 79 | Temp 99.0°F | Resp 16 | Wt 198.0 lb

## 2011-05-13 DIAGNOSIS — Z23 Encounter for immunization: Secondary | ICD-10-CM

## 2011-05-13 DIAGNOSIS — R143 Flatulence: Secondary | ICD-10-CM

## 2011-05-13 DIAGNOSIS — E119 Type 2 diabetes mellitus without complications: Secondary | ICD-10-CM

## 2011-05-13 DIAGNOSIS — R140 Abdominal distension (gaseous): Secondary | ICD-10-CM

## 2011-05-13 DIAGNOSIS — I10 Essential (primary) hypertension: Secondary | ICD-10-CM

## 2011-05-13 NOTE — Assessment & Plan Note (Signed)
Complicated by noncompliance Uncontrolled DM2 - again, pleaded for med compliance -  reviewed consquences of med noncompliance and uncontrolled DM -  pt with limited acceptance of information reminded exercise and diet unlikely to provided adequate medical control of DM but encouraged to comply with plans for same in addition to meds  Lab Results  Component Value Date   HGBA1C 11.8* 03/05/2011

## 2011-05-13 NOTE — Patient Instructions (Signed)
It was good to see you today. Test(s) ordered today. Your results will be called to you after review (48-72hours after test completion). If any changes need to be made, you will be notified at that time. Try [facility] for your bowels and gas - take one everyday for 30days Medications reviewed, no changes at this time. Please take all of them as prescribed for best results Please schedule followup in 4 months for diabetes mellitus check, call sooner if problems.

## 2011-05-13 NOTE — Progress Notes (Signed)
  Subjective:    Patient ID: Judith Shepherd, female    DOB: Mar 05, 1946, 65 y.o.   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   HTN - reports compliance with ongoing medical treatment and no changes in medication dose or frequency. denies adverse side effects related to current therapy.  no HA or CP -  dyslipidemia - reports variable compliance with ongoing medical treatment and no changes in medication dose or frequency. denies adverse side effects related to current therapy.   dCHF follow up - 10/2010 exac - appears euvolemic; denies dyspnea on exertion or edema change  Anemia, chronic dz - Denies BRBPR or epigastric pain - colo 03/2010 unremarkable   Past Medical History  Diagnosis Date  . Proliferative diabetic retinopathy   . CAD 10/2008 cath    [facility] with diffuse small branch CAD  . DIASTOLIC HEART FAILURE, CHRONIC     echo 10/2008  . Arthritis     R>L knee, declines TKR  . ANEMIA   . Diabetes mellitus, type 2   . DYSLIPIDEMIA   . HYPERTENSION   . CARPAL TUNNEL SYNDROME, BILATERAL     Review of Systems  Constitutional: Negative for fever.  Respiratory: Negative for shortness of breath.   Cardiovascular: Negative for chest pain.  Genitourinary: Negative for dysuria.      Objective:   Physical Exam  BP 162/88  Pulse 79  Temp(Src) 99 F (37.2 C) (Oral)  Resp 16  Wt 198 lb (89.812 kg)  SpO2 98% Wt Readings  from Last 3 Encounters:  05/13/11 198 lb (89.812 kg)  03/05/11 194 lb 12.8 oz (88.361 kg)  01/14/11 197 lb 12.8 oz (89.721 kg)   Constitutional: She is overweight; appears well-developed and well-nourished. No distress.  Cardiovascular: Normal rate, regular rhythm and normal heart sounds.  No murmur heard. trace BLE edema Pulmonary/Chest: Effort normal and breath sounds normal. No respiratory distress. She has no wheezes.  Abd: SNT, +BS, no rebound, no mass   Lab Results  Component Value Date   WBC 8.2 03/05/2011   HGB 9.7* 03/05/2011   HCT 29.6* 03/05/2011   PLT 230.0 03/05/2011   CHOL 178 06/04/2010   TRIG 112.0 06/04/2010   HDL 63.40 06/04/2010   LDLDIRECT 130.7 02/23/2010   ALT 14 03/05/2011   AST 13 03/05/2011   NA 137 03/05/2011   K 3.6 03/05/2011   CL 101 03/05/2011   CREATININE 1.4* 03/05/2011   BUN 26* 03/05/2011   CO2 28 03/05/2011   TSH 0.82 02/23/2010   INR 0.9 ratio 11/23/2008   HGBA1C 11.8* 03/05/2011   MICROALBUR 4.9* 02/23/2010     Assessment & Plan:  See problem list. Medications and labs reviewed today.  Bloating/gas - reassurance provided - recommended probiotic and beano or Gas X as needed

## 2011-05-13 NOTE — Assessment & Plan Note (Signed)
BP Readings from Last 3 Encounters:  05/13/11 162/88  03/05/11 164/88  01/14/11 142/76   The current medical regimen is effective if taken - success limited by compliance;   continue present plan and medications and requested participation in pt's prescribed regimen

## 2011-05-14 LAB — HEMOGLOBIN A1C: Hgb A1c MFr Bld: 11.1 % — ABNORMAL HIGH (ref 4.6–6.5)

## 2011-05-15 ENCOUNTER — Other Ambulatory Visit: Payer: Self-pay | Admitting: Cardiology

## 2011-05-15 NOTE — Telephone Encounter (Signed)
Pt rx request. Has not been seen in a long time and was asked to follow-up or be followed by PCP.

## 2011-05-15 NOTE — Telephone Encounter (Signed)
Judith Shepherd - ok to fill as requested

## 2011-08-01 IMAGING — CT CT ABD-PELV W/O CM
2 of 4 series · 17 of 46 positions shown, 19 images · non-contrast
Comparison: None.

CLINICAL DATA: The left flank pain.

CT ABDOMEN AND PELVIS WITHOUT CONTRAST
TECHNIQUE: Multidetector CT imaging of the abdomen and pelvis was
performed following the standard protocol without intravenous
contrast.

[Series 2: stone_wo 5.0 b40f st · axial · 0.81mm/px · z∈[-632,-212]mm · 14 of 92 slices shown, 16 images]
[im 4/92  soft-tissue]
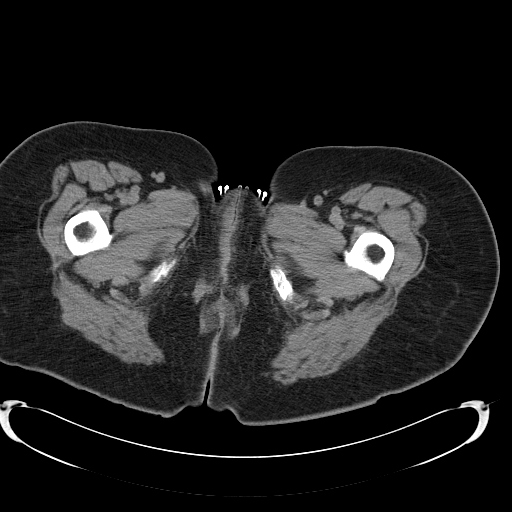
[im 4/92  bone]
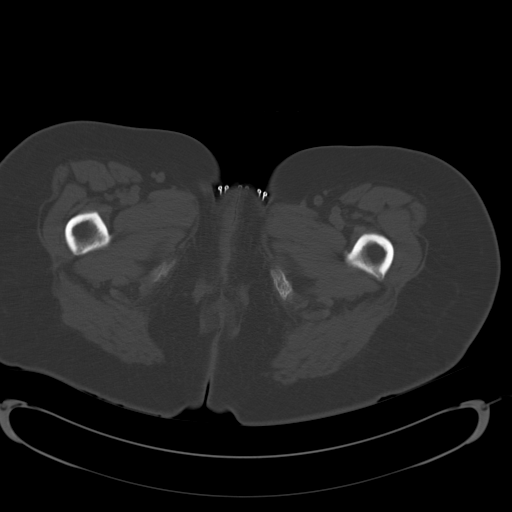
[im 11/92  soft-tissue]
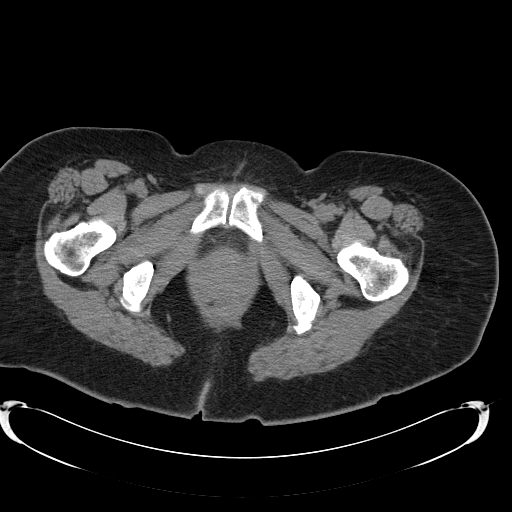
[im 19/92  soft-tissue]
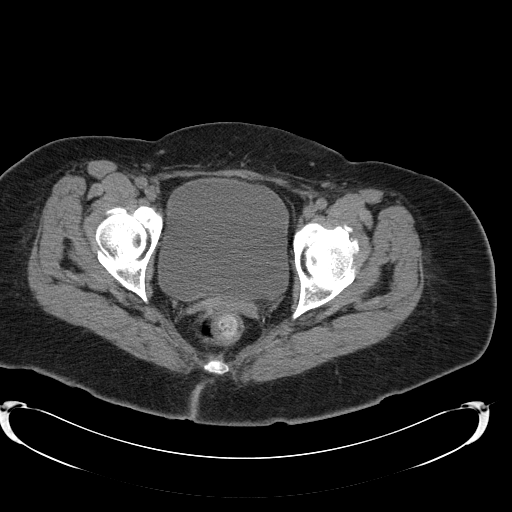
[im 26/92  soft-tissue]
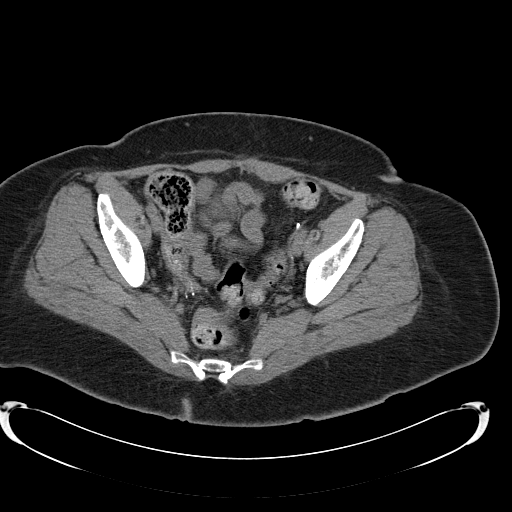
[im 30/92  soft-tissue]
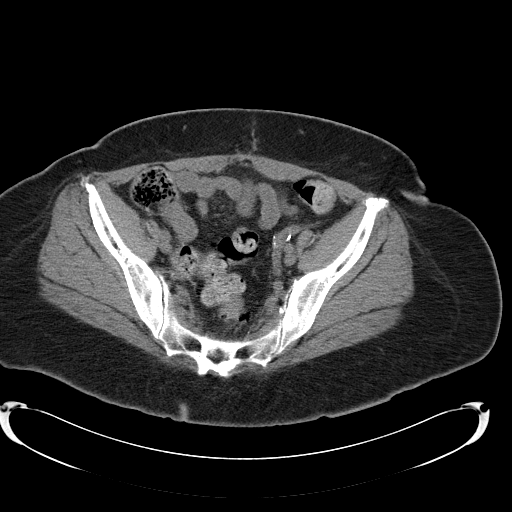
[im 37/92  soft-tissue]
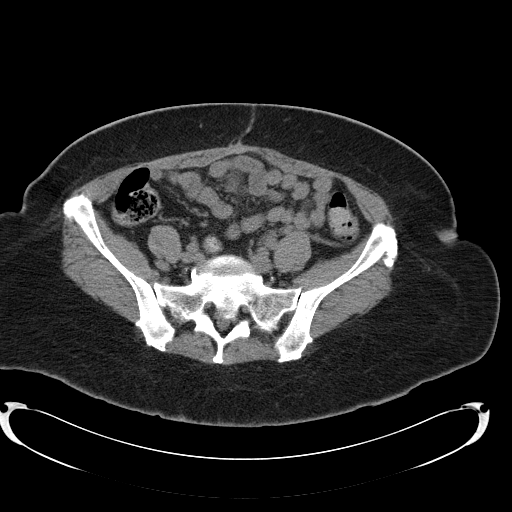
[im 44/92  soft-tissue]
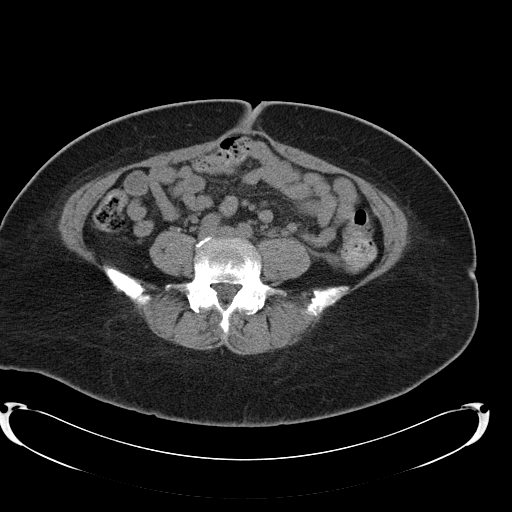
[im 48/92  soft-tissue]
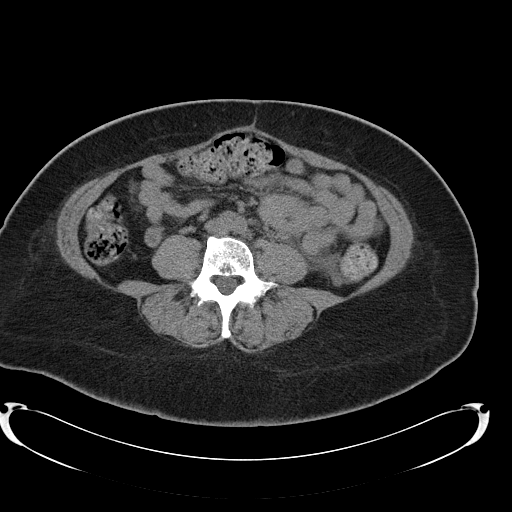
[im 55/92  soft-tissue]
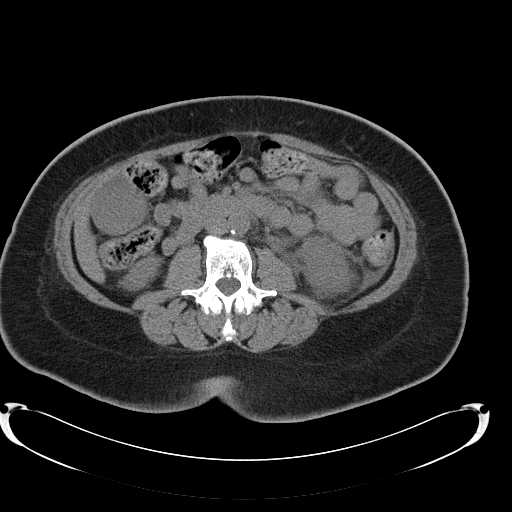
[im 55/92  bone]
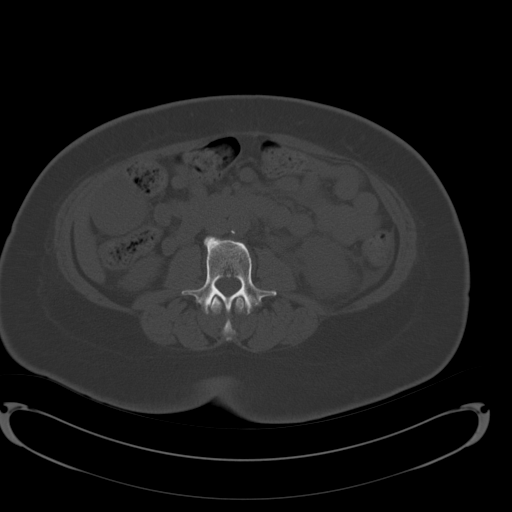
[im 62/92  soft-tissue]
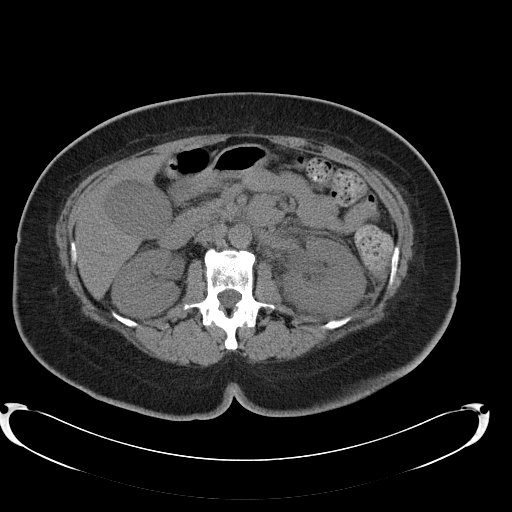
[im 70/92  soft-tissue]
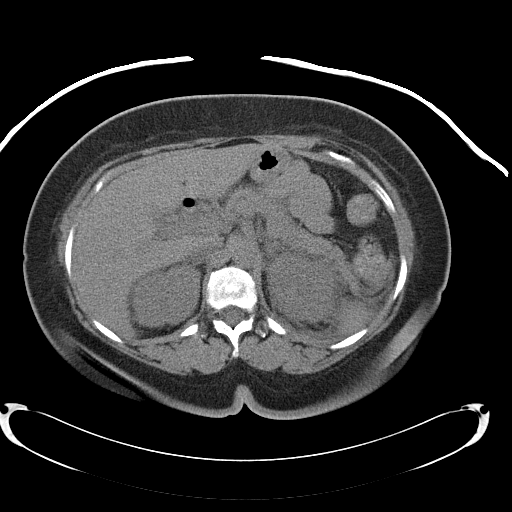
[im 73/92  soft-tissue]
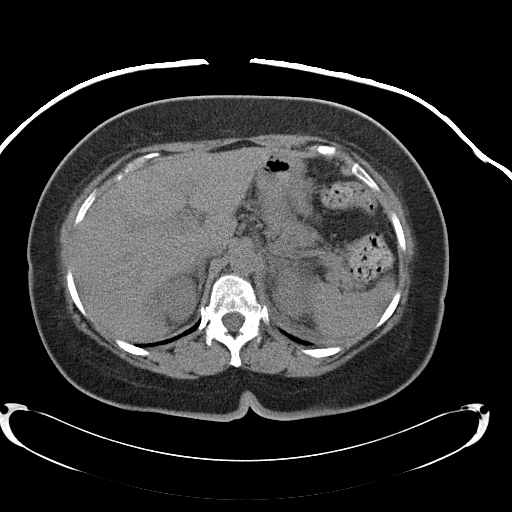
[im 81/92  soft-tissue]
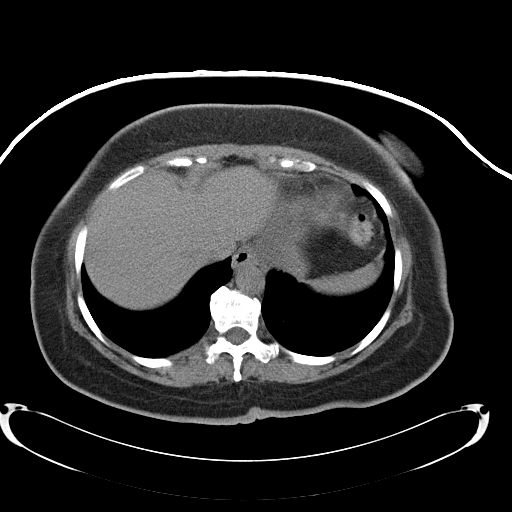
[im 88/92  soft-tissue]
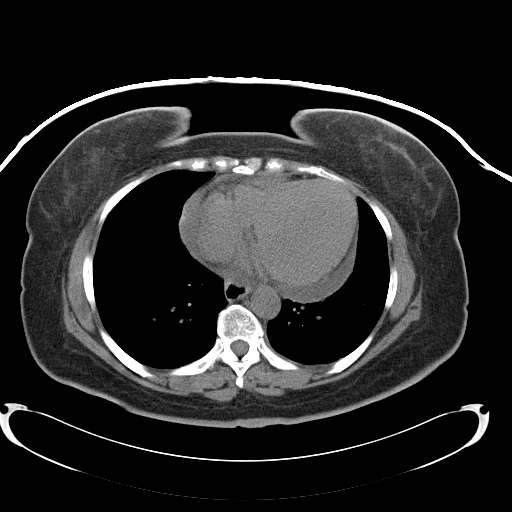

[Series 602: coronal · coronal · 0.93mm/px · 3 of 120 slices shown]
[im 40/120  soft-tissue]
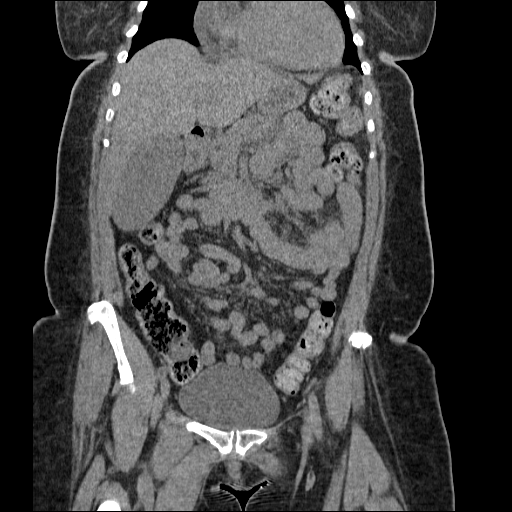
[im 53/120  soft-tissue]
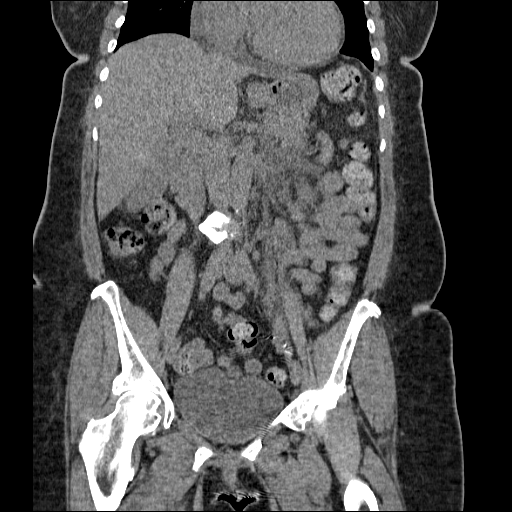
[im 67/120  soft-tissue]
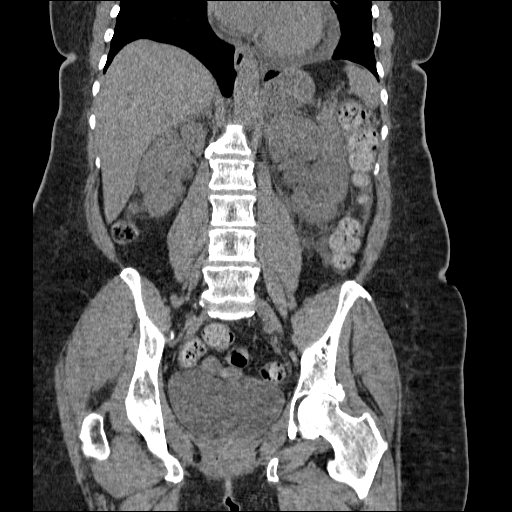

[17 of 46 positions shown; findings below may reference images not displayed]

FINDINGS: The lung bases are clear.  A moderate sized simple
attenuation pericardial effusion is seen.  The unenhanced
appearance of the liver, adrenal glands, spleen and pancreas is
normal.  The gallbladder is minimally distended but within normal
limits.  The right kidney is normal without stones or
hydronephrosis.  Left-sided hydronephrosis is seen with perinephric
stranding.  The left ureter is dilated as it enters into the pelvis
where there is a 4 mm obstructing stone. Three other punctate left
lower pole stones are seen.  The urinary bladder is unremarkable.
There is a small hiatal hernia.  The stomach is decompressed.  No
small bowel obstruction is seen.  The appendix is absent.  No
inflammatory changes are seen in the colon.  Uterus is absent.
Surgical clips are seen in the left adnexa and there is residual
soft tissue, presumed ovarian tissue.  No free fluid is seen in the
pelvis.  There is no lymphadenopathy.  Multilevel degenerative
changes are seen throughout the spine.
IMPRESSION: 1.  Moderate to severe left-sided hydronephrosis with a 4 mm
obstructing stone in the distal left ureter.  Perinephric stranding
and sub centimeter nonobstructing left lower pole stones are seen.
2.  Moderate sized simple attenuation pericardial effusion of
unknown etiology.
3.  Postsurgical changes noted in the pelvis.  Residual soft tissue
is seen in the expected location of the left adnexa, presumed
residual ovarian tissue.

## 2011-08-22 ENCOUNTER — Encounter: Payer: Self-pay | Admitting: Endocrinology

## 2011-09-16 ENCOUNTER — Other Ambulatory Visit (INDEPENDENT_AMBULATORY_CARE_PROVIDER_SITE_OTHER): Payer: Medicare Other

## 2011-09-16 ENCOUNTER — Encounter: Payer: Self-pay | Admitting: Internal Medicine

## 2011-09-16 ENCOUNTER — Ambulatory Visit (INDEPENDENT_AMBULATORY_CARE_PROVIDER_SITE_OTHER): Payer: Medicare Other | Admitting: Internal Medicine

## 2011-09-16 VITALS — BP 168/102 | HR 85 | Temp 98.4°F | Ht 66.0 in | Wt 204.0 lb

## 2011-09-16 DIAGNOSIS — D649 Anemia, unspecified: Secondary | ICD-10-CM

## 2011-09-16 DIAGNOSIS — D539 Nutritional anemia, unspecified: Secondary | ICD-10-CM

## 2011-09-16 DIAGNOSIS — E119 Type 2 diabetes mellitus without complications: Secondary | ICD-10-CM

## 2011-09-16 DIAGNOSIS — I10 Essential (primary) hypertension: Secondary | ICD-10-CM

## 2011-09-16 DIAGNOSIS — E785 Hyperlipidemia, unspecified: Secondary | ICD-10-CM

## 2011-09-16 LAB — CBC WITH DIFFERENTIAL/PLATELET
Basophils Absolute: 0 10*3/uL (ref 0.0–0.1)
HCT: 30 % — ABNORMAL LOW (ref 36.0–46.0)
Lymphs Abs: 1.4 10*3/uL (ref 0.7–4.0)
MCV: 80.9 fl (ref 78.0–100.0)
Monocytes Absolute: 0.6 10*3/uL (ref 0.1–1.0)
Neutrophils Relative %: 77.5 % — ABNORMAL HIGH (ref 43.0–77.0)
Platelets: 259 10*3/uL (ref 150.0–400.0)
RDW: 15.1 % — ABNORMAL HIGH (ref 11.5–14.6)

## 2011-09-16 LAB — LIPID PANEL
HDL: 51.7 mg/dL (ref >39.00)
Total CHOL/HDL Ratio: 4
Triglycerides: 122 mg/dL (ref 0.0–149.0)
VLDL: 24.4 mg/dL (ref 0.0–40.0)

## 2011-09-16 LAB — VITAMIN B12: Vitamin B-12: 503 pg/mL (ref 211–911)

## 2011-09-16 LAB — HEMOGLOBIN A1C: Hgb A1c MFr Bld: 11.3 % — ABNORMAL HIGH (ref 4.6–6.5)

## 2011-09-16 NOTE — Assessment & Plan Note (Addendum)
Complicated by noncompliance - has declined return to endocrine as advised (ell[facility]) Working with [facility] (optho) on retinopathy tx Uncontrolled DM2 - again, pleaded for med compliance -  reviewed consquences of med noncompliance and uncontrolled DM -  pt with limited acceptance of information reminded exercise and diet unlikely to provided adequate medical control of DM but encouraged to comply with plans for same in addition to meds Prescribed ACEI, ASA 81, statin - but variable compliance  Lab Results  Component Value Date   HGBA1C 11.1* 05/13/2011

## 2011-09-16 NOTE — Progress Notes (Signed)
  Subjective:    Patient ID: Judith Shepherd, female    DOB: 1946/01/17, 66 y.o.   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   HTN - reports variable compliance with ongoing medical treatment and no changes in medication dose or frequency. denies adverse side effects related to current therapy.  no headache or chest pain -  dyslipidemia - reports variable compliance with ongoing medical treatment and no changes in medication dose or frequency. denies adverse side effects related to current therapy.   dCHF - 10/2010 exac - appears euvolemic; variable lasix use - denies dyspnea on exertion or edema change  Anemia, chronic dz - Denies BRBPR or epigastric pain - colo 03/2010 unremarkable - pt requests B12 shot for energy   Past Medical History  Diagnosis Date  . Proliferative diabetic retinopathy   . CAD 10/2008 cath    [facility] with diffuse small branch CAD  . DIASTOLIC HEART FAILURE, CHRONIC     echo 10/2008  . Arthritis     R>L knee, declines TKR  . ANEMIA   . Diabetes mellitus, type 2     uncontrolled, med noncompliance  . DYSLIPIDEMIA   . HYPERTENSION   . CARPAL TUNNEL SYNDROME, BILATERAL     Review of Systems  Constitutional: Positive for fatigue. Negative for fever.  Respiratory: Negative for cough and shortness of breath.   Cardiovascular: Negative for chest pain, palpitations and leg swelling.  Genitourinary: Negative for dysuria and urgency.      Objective:   Physical Exam  BP 168/102  Pulse 85  Temp(Src) 98.4 F (36.9 C) (Oral)  Ht 5\' 6"  (1.676 m)  Wt  204 lb (92.534 kg)  BMI 32.93 kg/m2  SpO2 95% Wt Readings from Last 3 Encounters:  09/16/11 204 lb (92.534 kg)  05/13/11 198 lb (89.812 kg)  03/05/11 194 lb 12.8 oz (88.361 kg)   Constitutional: She is overweight; appears well-developed and well-nourished. No distress.  Cardiovascular: Normal rate, regular rhythm and normal heart sounds.  No murmur heard. trace BLE edema Pulmonary/Chest: Effort normal and breath sounds normal. No respiratory distress. She has no wheezes.     Lab Results  Component Value Date   WBC 8.2 03/05/2011   HGB 9.7* 03/05/2011   HCT 29.6* 03/05/2011   PLT 230.0 03/05/2011   CHOL 178 06/04/2010   TRIG 112.0 06/04/2010   HDL 63.40 06/04/2010   LDLDIRECT 130.7 02/23/2010   ALT 14 03/05/2011   AST 13 03/05/2011   NA 137 03/05/2011   K 3.6 03/05/2011   CL 101 03/05/2011   CREATININE 1.4* 03/05/2011   BUN 26* 03/05/2011   CO2 28 03/05/2011   TSH 0.82 02/23/2010   INR 0.9 ratio 11/23/2008   HGBA1C 11.1* 05/13/2011   MICROALBUR 4.9* 02/23/2010     Assessment & Plan:  See problem list. Medications and labs reviewed today.

## 2011-09-16 NOTE — Patient Instructions (Signed)
It was good to see you today. Test(s) ordered today. Your results will be called to you after review (48-72hours after test completion). If any changes need to be made, you will be notified at that time. Medications reviewed, no changes at this time. Please take all of them as prescribed for best results Please schedule followup in 4 months for diabetes mellitus check, call sooner if problems.

## 2011-09-16 NOTE — Assessment & Plan Note (Signed)
Chronic,  12/2009 colo with hemorrhoids, otherwise normal -  Recheck CBC, B12 and ferritin now -  Lab Results  Component Value Date   WBC 8.2 03/05/2011   HGB 9.7* 03/05/2011   HCT 29.6* 03/05/2011   MCV 82.4 03/05/2011   PLT 230.0 03/05/2011

## 2011-09-16 NOTE — Assessment & Plan Note (Signed)
BP Readings from Last 3 Encounters:  09/16/11 168/102  05/13/11 162/88  03/05/11 164/88   The current medical regimen is effective if taken - success limited by non compliance;   continue present plan and medications and requested participation in pt's prescribed regimen

## 2011-09-16 NOTE — Assessment & Plan Note (Signed)
rx'd statin - variable compliance The current medical regimen is effective;  continue present plan and medications. Lab Results  Component Value Date   LDLCALC 92 06/04/2010

## 2011-09-25 ENCOUNTER — Encounter: Payer: Self-pay | Admitting: Endocrinology

## 2011-09-25 ENCOUNTER — Ambulatory Visit: Payer: Medicare Other | Admitting: Internal Medicine

## 2011-09-25 ENCOUNTER — Ambulatory Visit (INDEPENDENT_AMBULATORY_CARE_PROVIDER_SITE_OTHER): Payer: Medicare Other | Admitting: Endocrinology

## 2011-09-25 VITALS — BP 148/78 | HR 79 | Temp 98.4°F | Ht 66.0 in | Wt 201.0 lb

## 2011-09-25 DIAGNOSIS — E1139 Type 2 diabetes mellitus with other diabetic ophthalmic complication: Secondary | ICD-10-CM

## 2011-09-25 DIAGNOSIS — H579 Unspecified disorder of eye and adnexa: Secondary | ICD-10-CM

## 2011-09-25 NOTE — Progress Notes (Signed)
Subjective:    Patient ID: Judith Shepherd, female    DOB: 1946/02/13, 66 y.o.   MRN: [id number]  HPI Pt returns for f/u of type 2 DM (1994).  Pt says she takes 3 meds as rx'ed.  She stopped actos last year. no cbg record, but states cbg's are approx 200.  She says she has intermittent mild hypoglycemia.  She says the cost of medication is very important to her.   Past Medical History  Diagnosis Date  . Proliferative diabetic retinopathy   . CAD 10/2008 cath    [facility] with diffuse small branch CAD  . DIASTOLIC HEART FAILURE, CHRONIC     echo 10/2008  . Arthritis     R>L knee, declines TKR  . ANEMIA   . Diabetes mellitus, type 2     uncontrolled, med noncompliance  . DYSLIPIDEMIA   . HYPERTENSION   . CARPAL TUNNEL SYNDROME, BILATERAL     Past Surgical History  Procedure Date  . Abdominal hysterectomy     History   Social History  . Marital Status: Divorced    Spouse Name: N/A    Number of Children: N/A  . Years of Education: N/A   Occupational History  . Not on file.   Social History Main Topics  . Smoking status: Never Smoker   . 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 Alcohol Use: No  . Drug Use: No  . Sexually Active:    Other Topics Concern  . 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 amLODipine-atorvastatin (CADUET) 10-80 MG per tablet Take 1 tablet by mouth every other day.        Marland Kitchen aspirin 81 MG tablet Take 81 mg by mouth daily.        . carvedilol (COREG) 25 MG tablet Take 1 tablet (25 mg total) by mouth 2 (two) times daily with a meal.  60 tablet  5  . fluticasone (FLONASE) 50 MCG/ACT nasal spray Place 2 sprays into the nose daily.  16 g  2  . furosemide (LASIX) 40 MG tablet Take 1 tablet (40 mg total) by mouth 2 (two) times  daily. Or as directed for fluid  60 tablet  11  . glyBURIDE-metformin (GLUCOVANCE) 2.5-500 MG per tablet Take 2 tablets by mouth 2 (two) times daily with a meal.  120 tablet  6  . lisinopril (PRINIVIL,ZESTRIL) 20 MG tablet TAKE ONE TABLET BY MOUTH EVERY DAY  30 tablet  3  . loratadine (CLARITIN) 10 MG tablet Take 1 tablet (10 mg total) by mouth daily as needed for allergies.  30 tablet  2  . potassium chloride (KLOR-CON 10) 10 MEQ CR tablet Take 1 tablet (10 mEq total) by mouth daily. Or as directed with fluid pill  60 tablet  11  . sitaGLIPtan (JANUVIA) 100 MG tablet Take 1 tablet (100 mg total) by mouth daily.  30 tablet  6  . traMADol (ULTRAM) 50 MG tablet TAKE ONE TABLET BY MOUTH EVERY 6 HOURS AS NEEDED FOR PAIN  45 tablet  0    No Known Allergies  Family History  Problem Relation Age of Onset  . Heart disease Mother   . Diabetes Mother   . Diabetes Sister   . Cancer  Sister   . Diabetes Sister     BP 148/78  Pulse 79  Temp(Src) 98.4 F (36.9 C) (Oral)  Ht 5\' 6"  (1.676 m)  Wt 201 lb (91.173 kg)  BMI 32.44 kg/m2  SpO2 97%    Review of Systems She has weight gain.      Objective:   Physical Exam VITAL SIGNS:  See vs page GENERAL: no distress.   Pulses: dorsalis pedis intact bilat.   Feet: no deformity.  no ulcer on the feet.  feet are of normal color and temp.  no edema.   Neuro: sensation is intact to touch on the feet, but decreased from normal.     Lab Results  Component Value Date   HGBA1C 11.3* 09/16/2011      Assessment & Plan:  Dm with severe hyperglycemia.  This causes a high risk to her health.  i demonstrated levemir pen

## 2011-09-25 NOTE — Patient Instructions (Addendum)
Start "levemir," 10 units each morning.  Here are some sample pens. good diet and exercise habits significanly improve the control of your diabetes.  please let me know if you wish to be referred to a dietician.  high blood sugar is very risky to your health.  you should see an eye doctor every year. controlling your blood pressure and cholesterol drastically reduces the damage diabetes does to your body.  this also applies to quitting smoking.  please discuss these with your doctor.  you should take an aspirin every day, unless you have been advised by a doctor not to. check your blood sugar 2 times a day.  vary the time of day when you check, between before the 3 meals, and at bedtime.  also check if you have symptoms of your blood sugar being too high or too low.  please keep a record of the readings and bring it to your next appointment here.  please call us sooner if your blood sugar goes below 70, or if it stays over 200.

## 2011-10-10 ENCOUNTER — Telehealth: Payer: Self-pay

## 2011-10-10 ENCOUNTER — Inpatient Hospital Stay (HOSPITAL_COMMUNITY)
Admission: EM | Admit: 2011-10-10 | Discharge: 2011-10-12 | DRG: 292 | Disposition: A | Discharge status: Home / Self Care | Payer: Medicare Other | Attending: Internal Medicine | Admitting: Internal Medicine

## 2011-10-10 ENCOUNTER — Encounter (HOSPITAL_COMMUNITY): Payer: Self-pay | Admitting: *Deleted

## 2011-10-10 ENCOUNTER — Emergency Department (HOSPITAL_COMMUNITY): Payer: Medicare Other

## 2011-10-10 DIAGNOSIS — D631 Anemia in chronic kidney disease: Secondary | ICD-10-CM | POA: Diagnosis present

## 2011-10-10 DIAGNOSIS — N182 Chronic kidney disease, stage 2 (mild): Secondary | ICD-10-CM | POA: Diagnosis present

## 2011-10-10 DIAGNOSIS — I5031 Acute diastolic (congestive) heart failure: Secondary | ICD-10-CM

## 2011-10-10 DIAGNOSIS — I319 Disease of pericardium, unspecified: Secondary | ICD-10-CM | POA: Diagnosis present

## 2011-10-10 DIAGNOSIS — J309 Allergic rhinitis, unspecified: Secondary | ICD-10-CM | POA: Diagnosis present

## 2011-10-10 DIAGNOSIS — I5032 Chronic diastolic (congestive) heart failure: Secondary | ICD-10-CM

## 2011-10-10 DIAGNOSIS — D649 Anemia, unspecified: Secondary | ICD-10-CM

## 2011-10-10 DIAGNOSIS — I5033 Acute on chronic diastolic (congestive) heart failure: Principal | ICD-10-CM | POA: Diagnosis present

## 2011-10-10 DIAGNOSIS — IMO0001 Reserved for inherently not codable concepts without codable children: Secondary | ICD-10-CM | POA: Diagnosis present

## 2011-10-10 DIAGNOSIS — I129 Hypertensive chronic kidney disease with stage 1 through stage 4 chronic kidney disease, or unspecified chronic kidney disease: Secondary | ICD-10-CM | POA: Diagnosis present

## 2011-10-10 DIAGNOSIS — E785 Hyperlipidemia, unspecified: Secondary | ICD-10-CM | POA: Diagnosis present

## 2011-10-10 DIAGNOSIS — R0989 Other specified symptoms and signs involving the circulatory and respiratory systems: Secondary | ICD-10-CM

## 2011-10-10 DIAGNOSIS — N179 Acute kidney failure, unspecified: Secondary | ICD-10-CM | POA: Diagnosis present

## 2011-10-10 DIAGNOSIS — I509 Heart failure, unspecified: Secondary | ICD-10-CM | POA: Diagnosis present

## 2011-10-10 DIAGNOSIS — I10 Essential (primary) hypertension: Secondary | ICD-10-CM

## 2011-10-10 LAB — DIFFERENTIAL
Basophils Absolute: 0 10*3/uL (ref 0.0–0.1)
Basophils Relative: 0 % (ref 0–1)
Eosinophils Absolute: 0.2 10*3/uL (ref 0.0–0.7)
Neutrophils Relative %: 70 % (ref 43–77)

## 2011-10-10 LAB — CBC
MCH: 25.5 pg — ABNORMAL LOW (ref 26.0–34.0)
MCHC: 31.4 g/dL (ref 30.0–36.0)
Platelets: 292 10*3/uL (ref 150–400)
RDW: 14.1 % (ref 11.5–15.5)

## 2011-10-10 LAB — BASIC METABOLIC PANEL
Calcium: 9.1 mg/dL (ref 8.4–10.5)
GFR calc non Af Amer: 42 mL/min — ABNORMAL LOW (ref >90)
Glucose, Bld: 285 mg/dL — ABNORMAL HIGH (ref 70–99)
Sodium: 139 mEq/L (ref 135–145)

## 2011-10-10 LAB — TROPONIN I: Troponin I: <0.3 ng/mL (ref <0.30)

## 2011-10-10 LAB — HEPATIC FUNCTION PANEL
ALT: 9 U/L (ref 0–35)
AST: 13 U/L (ref 0–37)
Albumin: 3.2 g/dL — ABNORMAL LOW (ref 3.5–5.2)
Alkaline Phosphatase: 89 U/L (ref 39–117)
Total Bilirubin: 0.2 mg/dL — ABNORMAL LOW (ref 0.3–1.2)

## 2011-10-10 LAB — CARDIAC PANEL(CRET KIN+CKTOT+MB+TROPI): Relative Index: 2.7 — ABNORMAL HIGH (ref 0.0–2.5)

## 2011-10-10 LAB — RETICULOCYTES: Retic Ct Pct: 1.9 % (ref 0.4–3.1)

## 2011-10-10 LAB — PRO B NATRIURETIC PEPTIDE: Pro B Natriuretic peptide (BNP): 2354 pg/mL — ABNORMAL HIGH (ref 0–125)

## 2011-10-10 MED ORDER — ATORVASTATIN CALCIUM 80 MG PO TABS
80.0000 mg | ORAL_TABLET | ORAL | Status: DC
  Administered 2011-10-10 – 2011-10-12 (×2): 80 mg via ORAL
  Filled 2011-10-10 (×2): qty 1

## 2011-10-10 MED ORDER — FUROSEMIDE 10 MG/ML IJ SOLN
40.0000 mg | Freq: Every day | INTRAMUSCULAR | Status: DC
  Administered 2011-10-11: 40 mg via INTRAVENOUS
  Filled 2011-10-10: qty 4

## 2011-10-10 MED ORDER — DEXTROSE 5 % IV SOLN
500.0000 mg | INTRAVENOUS | Status: DC
  Administered 2011-10-10: 500 mg via INTRAVENOUS
  Filled 2011-10-10: qty 500

## 2011-10-10 MED ORDER — INSULIN ASPART 100 UNIT/ML ~~LOC~~ SOLN
0.0000 [IU] | SUBCUTANEOUS | Status: DC
  Administered 2011-10-11 (×4): 3 [IU] via SUBCUTANEOUS
  Filled 2011-10-10: qty 1

## 2011-10-10 MED ORDER — GLYBURIDE-METFORMIN 2.5-500 MG PO TABS: 2.0000 | ORAL_TABLET | Freq: Two times a day (BID) | ORAL | Status: DC

## 2011-10-10 MED ORDER — SODIUM CHLORIDE 0.9 % IJ SOLN: 3.0000 mL | INTRAMUSCULAR | Status: DC | PRN

## 2011-10-10 MED ORDER — SODIUM CHLORIDE 0.9 % IJ SOLN: 3.0000 mL | Freq: Two times a day (BID) | INTRAMUSCULAR | Status: DC

## 2011-10-10 MED ORDER — AMLODIPINE-ATORVASTATIN 10-80 MG PO TABS: 1.0000 | ORAL_TABLET | ORAL | Status: DC

## 2011-10-10 MED ORDER — LINAGLIPTIN 5 MG PO TABS
5.0000 mg | ORAL_TABLET | Freq: Every day | ORAL | Status: DC
  Administered 2011-10-11 – 2011-10-12 (×2): 5 mg via ORAL
  Filled 2011-10-10 (×2): qty 1

## 2011-10-10 MED ORDER — SODIUM CHLORIDE 0.9 % IJ SOLN
3.0000 mL | Freq: Two times a day (BID) | INTRAMUSCULAR | Status: DC
  Administered 2011-10-11 – 2011-10-12 (×3): 3 mL via INTRAVENOUS

## 2011-10-10 MED ORDER — HYDROCODONE-ACETAMINOPHEN 5-325 MG PO TABS: 1.0000 | ORAL_TABLET | ORAL | Status: DC | PRN

## 2011-10-10 MED ORDER — METFORMIN HCL 500 MG PO TABS
1000.0000 mg | ORAL_TABLET | Freq: Two times a day (BID) | ORAL | Status: DC
  Administered 2011-10-11: 1000 mg via ORAL
  Filled 2011-10-10 (×2): qty 2

## 2011-10-10 MED ORDER — GLYBURIDE 5 MG PO TABS
5.0000 mg | ORAL_TABLET | Freq: Two times a day (BID) | ORAL | Status: DC
  Administered 2011-10-11 – 2011-10-12 (×3): 5 mg via ORAL
  Filled 2011-10-10 (×4): qty 1

## 2011-10-10 MED ORDER — ONDANSETRON HCL 4 MG PO TABS: 4.0000 mg | ORAL_TABLET | Freq: Four times a day (QID) | ORAL | Status: DC | PRN

## 2011-10-10 MED ORDER — AMLODIPINE BESYLATE 10 MG PO TABS
10.0000 mg | ORAL_TABLET | ORAL | Status: DC
  Administered 2011-10-10 – 2011-10-12 (×2): 10 mg via ORAL
  Filled 2011-10-10 (×2): qty 1

## 2011-10-10 MED ORDER — SODIUM CHLORIDE 0.9 % IV SOLN: 250.0000 mL | INTRAVENOUS | Status: DC | PRN

## 2011-10-10 MED ORDER — FUROSEMIDE 10 MG/ML IJ SOLN
80.0000 mg | Freq: Once | INTRAMUSCULAR | Status: AC
  Administered 2011-10-10: 80 mg via INTRAVENOUS
  Filled 2011-10-10: qty 8

## 2011-10-10 MED ORDER — ENOXAPARIN SODIUM 30 MG/0.3ML ~~LOC~~ SOLN
30.0000 mg | SUBCUTANEOUS | Status: DC
  Filled 2011-10-10: qty 0.3

## 2011-10-10 MED ORDER — CARVEDILOL 25 MG PO TABS
25.0000 mg | ORAL_TABLET | Freq: Two times a day (BID) | ORAL | Status: DC
  Administered 2011-10-11 – 2011-10-12 (×3): 25 mg via ORAL
  Filled 2011-10-10 (×4): qty 1

## 2011-10-10 MED ORDER — ONDANSETRON HCL 4 MG/2ML IJ SOLN
4.0000 mg | Freq: Four times a day (QID) | INTRAMUSCULAR | Status: DC | PRN
  Administered 2011-10-11: 4 mg via INTRAVENOUS
  Filled 2011-10-10: qty 2

## 2011-10-10 MED ORDER — ASPIRIN 325 MG PO TABS
325.0000 mg | ORAL_TABLET | Freq: Every day | ORAL | Status: DC
  Administered 2011-10-11 – 2011-10-12 (×2): 325 mg via ORAL
  Filled 2011-10-10 (×2): qty 1

## 2011-10-10 MED ORDER — LISINOPRIL 20 MG PO TABS
20.0000 mg | ORAL_TABLET | Freq: Every day | ORAL | Status: DC
  Administered 2011-10-11 – 2011-10-12 (×2): 20 mg via ORAL
  Filled 2011-10-10 (×2): qty 1

## 2011-10-10 MED ORDER — FUROSEMIDE 10 MG/ML IJ SOLN: 40.0000 mg | Freq: Every day | INTRAMUSCULAR | Status: DC

## 2011-10-10 NOTE — ED Notes (Signed)
Pt states she started to feel sob a couple of days ago.pt states she becomes more sob with activity. Pt denies any chest pain. Pt denies cough

## 2011-10-10 NOTE — H&P (Addendum)
PCP:  Rene Paci, MD, MD   DOA:  10/10/2011  6:56 PM  Chief Complaint:  Shortness of breath  HPI: Pt is 66 yo female with history of CHF and last 2 D ECHO done on 2010 with EF of 65%, now presents to [facility] ED with main concern of progressively worsening shortness of breath that started several days prior to admission and associated with 3- pillow orthopnea. Pt also reports intermittently productive cough of yellowish sputum, with no specific alleviating or aggravating factors. Pt reports similar episodes in the past and tells me she thinks it is related to her CHF. She denies chest pain other than the one associated with cough, no fevers, no chills, no specific abdominal or urinary concerns, no specific focal neurological weakness, no visual changes and no headache. No other systemic symptoms.   Allergies: No Known Allergies  Prior to Admission medications   Medication Sig Start Date End Date Taking? Authorizing Provider  amLODipine-atorvastatin (CADUET) 10-80 MG per tablet Take 1 tablet by mouth every other day.     Yes Historical Provider, MD  aspirin 325 MG tablet Take 325 mg by mouth daily.   Yes Historical Provider, MD  carvedilol (COREG) 25 MG tablet Take 1 tablet (25 mg total) by mouth 2 (two) times daily with a meal. 05/15/11  Yes Newt Lukes, MD  furosemide (LASIX) 40 MG tablet Take 40 mg by mouth 2 (two) times daily.  01/14/11  Yes Newt Lukes, MD  glyBURIDE-metformin (GLUCOVANCE) 2.5-500 MG per tablet Take 2 tablets by mouth 2 (two) times daily with a meal. 11/27/10  Yes Newt Lukes, MD  Insulin Detemir (LEVEMIR FLEXPEN [place]) Inject 10 Units into the skin every morning.   Yes Historical Provider, MD  lisinopril (PRINIVIL,ZESTRIL) 20 MG tablet TAKE ONE TABLET BY MOUTH EVERY DAY 01/24/11  Yes Laurey Morale, MD  potassium chloride (KLOR-CON 10) 10 MEQ CR tablet Take 10 mEq by mouth daily.  11/22/10  Yes Newt Lukes, MD  sitaGLIPtin (JANUVIA) 100 MG tablet  Take 100 mg by mouth daily.   Yes Historical Provider, MD  traMADol (ULTRAM) 50 MG tablet TAKE ONE TABLET BY MOUTH EVERY 6 HOURS AS NEEDED FOR PAIN 12/23/10  Yes Romero Belling, MD    Past Medical History  Diagnosis Date  . Proliferative diabetic retinopathy   . CAD 10/2008 cath    [facility] with diffuse small branch CAD  . DIASTOLIC HEART FAILURE, CHRONIC     echo 10/2008  . Arthritis     R>L knee, declines TKR  . ANEMIA   . Diabetes mellitus, type 2     uncontrolled, med noncompliance  . DYSLIPIDEMIA   . HYPERTENSION   . CARPAL TUNNEL SYNDROME, BILATERAL     Past Surgical History  Procedure Date  . Abdominal hysterectomy     Social History:  reports that she has never smoked. She has never used smokeless tobacco. She reports that she does not drink alcohol or use illicit drugs.  Family History  Problem Relation Age of Onset  . Heart disease Mother   . Diabetes Mother   . Diabetes Sister   . Cancer Sister   . Diabetes Sister     Review of Systems:  Constitutional: Denies fever, chills, diaphoresis, appetite change and fatigue.  HEENT: Denies photophobia, eye pain, redness, hearing loss, ear pain, congestion, sore throat, rhinorrhea, sneezing, mouth sores, trouble swallowing, neck pain, neck stiffness and tinnitus.   Respiratory: Denies chest tightness,  and wheezing.  Cardiovascular: Denies chest pain, palpitations and leg swelling.  Gastrointestinal: Denies nausea, vomiting, abdominal pain, diarrhea, constipation, blood in stool and abdominal distention.  Genitourinary: Denies dysuria, urgency, frequency, hematuria, flank pain and difficulty urinating.  Musculoskeletal: Denies myalgias, back pain, joint swelling, arthralgias and gait problem.  Skin: Denies pallor, rash and wound.  Neurological: Denies dizziness, seizures, syncope, weakness, light-headedness, numbness and headaches.  Hematological: Denies adenopathy. Easy bruising, personal or family bleeding history    Psychiatric/Behavioral: Denies suicidal ideation, mood changes, confusion, nervousness, sleep disturbance and agitation  Physical Exam:  Filed Vitals:   10/10/11 1900 10/10/11 1909 10/10/11 2214  BP: 191/81 191/76 189/64  Pulse: 66 65 71  Temp: 98.9 F (37.2 C)    TempSrc: Oral    Resp: 16 16 23   Weight: 90.719 kg (200 lb)    SpO2: 97% 95% 97%    Constitutional: Vital signs reviewed.  Patient is in no acute distress and cooperative with exam. Alert and oriented x3.  Head: Normocephalic and atraumatic Ear: TM normal bilaterally Mouth: no erythema or exudates, MMM Eyes: PERRL, EOMI, conjunctivae normal, No scleral icterus.  Neck: Supple, Trachea midline normal ROM, No JVD, mass, thyromegaly, or carotid bruit present.  Cardiovascular: RRR, S1 normal, S2 normal, no MRG, pulses symmetric and intact bilaterally Pulmonary/Chest: decreased breath sounds bilaterally with crackles at bases mostly, no wheezes, rales, or rhonchi Abdominal: Soft. Non-tender, non-distended, bowel sounds are normal, no masses, organomegaly, or guarding present.  GU: no CVA tenderness Musculoskeletal: No joint deformities, erythema, or stiffness, ROM full and no nontender Ext: bilateral pitting edema + 2, no cyanosis, pulses palpable bilaterally (DP and PT) Hematology: no cervical, inginal, or axillary adenopathy.  Neurological: A&O x3, Strenght is normal and symmetric bilaterally, cranial nerve II-XII are grossly intact, no focal motor deficit, sensory intact to light touch bilaterally.  Skin: Warm, dry and intact. No rash, cyanosis, or clubbing.  Psychiatric: Normal mood and affect. speech and behavior is normal. Judgment and thought content normal. Cognition and memory are normal.   Labs on Admission:  Results for orders placed during the hospital encounter of 10/10/11 (from the past 48 hour(s))  CBC     Status: Abnormal   Collection Time   10/10/11  8:57 PM      Component Value Range Comment   WBC 8.9   4.0 - 10.5 (K/uL)    RBC 3.25 (*) 3.87 - 5.11 (MIL/uL)    Hemoglobin 8.3 (*) 12.0 - 15.0 (g/dL)    HCT 16.1 (*) 09.6 - 46.0 (%)    MCV 81.2  78.0 - 100.0 (fL)    MCH 25.5 (*) 26.0 - 34.0 (pg)    MCHC 31.4  30.0 - 36.0 (g/dL)    RDW 04.5  40.9 - 81.1 (%)    Platelets 292  150 - 400 (K/uL)   DIFFERENTIAL     Status: Normal   Collection Time   10/10/11  8:57 PM      Component Value Range Comment   Neutrophils Relative 70  43 - 77 (%)    Neutro Abs 6.3  1.7 - 7.7 (K/uL)    Lymphocytes Relative 21  12 - 46 (%)    Lymphs Abs 1.9  0.7 - 4.0 (K/uL)    Monocytes Relative 6  3 - 12 (%)    Monocytes Absolute 0.6  0.1 - 1.0 (K/uL)    Eosinophils Relative 2  0 - 5 (%)    Eosinophils Absolute 0.2  0.0 - 0.7 (K/uL)  Basophils Relative 0  0 - 1 (%)    Basophils Absolute 0.0  0.0 - 0.1 (K/uL)   PRO B NATRIURETIC PEPTIDE     Status: Abnormal   Collection Time   10/10/11  8:57 PM      Component Value Range Comment   Pro B Natriuretic peptide (BNP) 2354.0 (*) 0 - 125 (pg/mL)   BASIC METABOLIC PANEL     Status: Abnormal   Collection Time   10/10/11  8:57 PM      Component Value Range Comment   Sodium 139  135 - 145 (mEq/L)    Potassium 3.6  3.5 - 5.1 (mEq/L)    Chloride 100  96 - 112 (mEq/L)    CO2 29  19 - 32 (mEq/L)    Glucose, Bld 285 (*) 70 - 99 (mg/dL)    BUN 21  6 - 23 (mg/dL)    Creatinine, Ser 1.61 (*) 0.50 - 1.10 (mg/dL)    Calcium 9.1  8.4 - 10.5 (mg/dL)    GFR calc non Af Amer 42 (*) >90 (mL/min)    GFR calc Af Amer 48 (*) >90 (mL/min)   TROPONIN I     Status: Normal   Collection Time   10/10/11  8:57 PM      Component Value Range Comment   Troponin I <0.30  <0.30 (ng/mL)     Radiological Exams on Admission:  CXR 10/10/2011 1. Moderate - severe enlargement of the cardiopericardial silhouette which has an appearance that could suggest the presence of an enlarging pericardial effusion. Clinical correlation is  recommended, with consideration for further evaluation with  echocardiography if clinically indicated.  2. Because the enlarged cardiopericardial silhouette the left base is poorly evaluated. There may be an area of atelectasis and/or consolidation in the left lower lobe, and there may also be a small to moderate left-sided pleural effusion. This could be better evaluated with standing PA and lateral chest radiographs if clinically indicated.  Assessment/Plan  Shortness of breath - this is most likely secondary to CHF exacerbation, ? pericardial effusion - will admit the pt to telemetry floor and will obtain CE's x 3 sets, 12 lead EKG, 2 D ECHO for better characterization of ? Pericardial effusion - please note that the pericardial effusion was identified on 2 D ECHO in 2010 and this may be progression of it - PNA can not be entirely excluded and for that reason will treat with empiric abx Zithromax - may d/c in AM if no signs of an infectious etiology - check TSH  Anemia - of chronic disease - no recent anemia panle - review of records indicate  That pt's baseline Hg ~9 - 10 - this is new slight drop - will check anemia panel - CBC In AM  ARF - likely secondary to volume status - will start lasix IV and see if kidney function improves - will check urine sodium and urine creatinine - BMP in AM  Diabetes, uncontrolled and with complications - persistently elevated A1C > 11 since 2012 - will continue home medication regimen and will temporarily start SSI - diabetic education with specialist - check A1C - heart healthy diet  HTN, UNCONTROLLED - continue home BP medication regimen - readjust the medication regimen as indicated  DVT Prophylaxis - Lovenox  Code Status - Full  Education  - test results and diagnostic studies were discussed with patient  - patient verbalized the understanding - questions were answered at the bedside and contact information  was provided for additional questions or concerns  Time Spent on Admission: Over  30 minutes  MAGICK-Judith Shepherd 10/10/2011, 10:25 PM  [facility] Pager # [phone] Main Office # [phone]

## 2011-10-10 NOTE — ED Provider Notes (Signed)
Medical screening examination/treatment/procedure(s) were conducted as a shared visit with non-physician practitioner(s) and myself.  I personally evaluated the patient during the encounter This elderly female presents in no distress with concerns of new dyspnea, as well as orthopnea.  On my exam the patient is in no distress, speaking clearly, but given the patient's labs, chest x-ray which was consistent with heart failure exacerbation she was provided IV Lasix, and admitted for further evaluation and management.  Given the absence of distress, remarkably abnormal vital signs, emergent echocardiogram was done indicating, though the consideration of pericardial effusion is considered, and will be re-addressed by the admitting team.  Gerhard Munch, MD 10/10/11 2342

## 2011-10-10 NOTE — Telephone Encounter (Signed)
Pt called c/o of wheezing, dyspnea and SOB worse at night. Pt was advised to make OV (no same day OV available) but declined stating that she preferred to go to ER.

## 2011-10-10 NOTE — ED Notes (Signed)
[place], MD at bedside.  

## 2011-10-10 NOTE — ED Provider Notes (Signed)
History     CSN: [id number]  Arrival date & time 10/10/11  4098   First MD Initiated Contact with Patient 10/10/11 2017      Chief Complaint  Patient presents with  . Shortness of Breath  . Wheezing    (Consider location/radiation/quality/duration/timing/severity/associated sxs/prior treatment) Patient is a 66 y.o. female presenting with shortness of breath. The history is provided by the patient.  Shortness of Breath  The current episode started yesterday. The problem occurs frequently. The problem has been gradually worsening. The problem is moderate. The symptoms are relieved by rest. The symptoms are aggravated by activity and a supine position. Associated symptoms include orthopnea, shortness of breath and wheezing. Pertinent negatives include no chest pain, no fever and no cough. Associated symptoms comments: She started having SOB similar to previous episodes of CHF. No cough or fever. She complains of upper abdominal soreness bilaterally, also since last night. She reports having to sleep elevated on 3 pillows last night.. There was no intake of a foreign body. She was not exposed to toxic fumes. Her past medical history does not include asthma. There were no sick contacts.    Past Medical History  Diagnosis Date  . Proliferative diabetic retinopathy   . CAD 10/2008 cath    [facility] with diffuse small branch CAD  . DIASTOLIC HEART FAILURE, CHRONIC     echo 10/2008  . Arthritis     R>L knee, declines TKR  . ANEMIA   . Diabetes mellitus, type 2     uncontrolled, med noncompliance  . DYSLIPIDEMIA   . HYPERTENSION   . CARPAL TUNNEL SYNDROME, BILATERAL     Past Surgical History  Procedure Date  . Abdominal hysterectomy     Family History  Problem Relation Age of Onset  . Heart disease Mother   . Diabetes Mother   . Diabetes Sister   . Cancer Sister   . Diabetes Sister     History  Substance Use Topics  . Smoking status: Never Smoker   . 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 Alcohol Use: No    OB History    Grav Para Term Preterm Abortions TAB SAB Ect Mult Living                  Review of Systems  Constitutional: Negative for fever and chills.  HENT: Negative.   Respiratory: Positive for shortness of breath and wheezing. Negative for cough.   Cardiovascular: Positive for orthopnea. Negative for chest pain and leg swelling.  Gastrointestinal: Positive for abdominal pain. Negative for abdominal distention.  Musculoskeletal: Negative.   Skin: Negative.   Neurological: Negative.     Allergies  Review of patient's allergies indicates no known allergies.  Home Medications   Current Outpatient Rx  Name Route Sig Dispense Refill  . AMLODIPINE-ATORVASTATIN 10-80 MG PO TABS Oral Take 1 tablet by mouth every other day.      . ASPIRIN 325 MG PO TABS Oral Take 325 mg by mouth daily.    Marland Kitchen CARVEDILOL 25 MG PO TABS Oral Take 1 tablet (25 mg total) by mouth 2 (two) times daily with a meal. 60 tablet 5  . FUROSEMIDE 40 MG PO TABS Oral Take 40 mg by mouth 2 (two) times daily.  60 tablet 11  . GLYBURIDE-METFORMIN 2.5-500 MG PO TABS Oral Take 2 tablets by mouth 2 (two) times daily with a meal. 120 tablet 6  .  LEVEMIR FLEXPEN [place] Subcutaneous Inject 10 Units into the skin every morning.    Marland Kitchen LISINOPRIL 20 MG PO TABS  TAKE ONE TABLET BY MOUTH EVERY DAY 30 tablet 3  . POTASSIUM CHLORIDE 10 MEQ PO TBCR Oral Take 10 mEq by mouth daily.  60 tablet 11  . SITAGLIPTIN PHOSPHATE 100 MG PO TABS Oral Take 100 mg by mouth daily.    . TRAMADOL HCL 50 MG PO TABS  TAKE ONE TABLET BY MOUTH EVERY 6 HOURS AS NEEDED FOR PAIN 45 tablet 0    BP 191/76  Pulse 65  Temp(Src) 98.9 F (37.2 C) (Oral)  Resp 16  Wt 200 lb (90.719 kg)  SpO2 95%  Physical Exam  Constitutional: She appears well-developed and well-nourished.  HENT:  Head: Normocephalic.  Neck: Normal range of motion. Neck supple.    Cardiovascular: Normal rate and regular rhythm.   Pulmonary/Chest: Effort normal and breath sounds normal.  Abdominal: Soft. Bowel sounds are normal. There is no rebound and no guarding.       Mild tenderness at far left and right upper abdomen. No distention. Deeper RUQ and LUQ abdomen non-tender.  Musculoskeletal: Normal range of motion. She exhibits no edema.  Neurological: She is alert. No cranial nerve deficit.  Skin: Skin is warm and dry. No rash noted.  Psychiatric: She has a normal mood and affect.    ED Course  Procedures (including critical care time)   Labs Reviewed  CBC  DIFFERENTIAL  PRO B NATRIURETIC PEPTIDE  BASIC METABOLIC PANEL  TROPONIN I   No results found. Results for orders placed during the hospital encounter of 10/10/11  CBC      Component Value Range   WBC 8.9  4.0 - 10.5 (K/uL)   RBC 3.25 (*) 3.87 - 5.11 (MIL/uL)   Hemoglobin 8.3 (*) 12.0 - 15.0 (g/dL)   HCT 11.9 (*) 14.7 - 46.0 (%)   MCV 81.2  78.0 - 100.0 (fL)   MCH 25.5 (*) 26.0 - 34.0 (pg)   MCHC 31.4  30.0 - 36.0 (g/dL)   RDW 82.9  56.2 - 13.0 (%)   Platelets 292  150 - 400 (K/uL)  DIFFERENTIAL      Component Value Range   Neutrophils Relative 70  43 - 77 (%)   Neutro Abs 6.3  1.7 - 7.7 (K/uL)   Lymphocytes Relative 21  12 - 46 (%)   Lymphs Abs 1.9  0.7 - 4.0 (K/uL)   Monocytes Relative 6  3 - 12 (%)   Monocytes Absolute 0.6  0.1 - 1.0 (K/uL)   Eosinophils Relative 2  0 - 5 (%)   Eosinophils Absolute 0.2  0.0 - 0.7 (K/uL)   Basophils Relative 0  0 - 1 (%)   Basophils Absolute 0.0  0.0 - 0.1 (K/uL)  PRO B NATRIURETIC PEPTIDE      Component Value Range   Pro B Natriuretic peptide (BNP) 2354.0 (*) 0 - 125 (pg/mL)  BASIC METABOLIC PANEL      Component Value Range   Sodium 139  135 - 145 (mEq/L)   Potassium 3.6  3.5 - 5.1 (mEq/L)   Chloride 100  96 - 112 (mEq/L)   CO2 29  19 - 32 (mEq/L)   Glucose, Bld 285 (*) 70 - 99 (mg/dL)   BUN 21  6 - 23 (mg/dL)   Creatinine, Ser 8.65 (*) 0.50 -  1.10 (mg/dL)   Calcium 9.1  8.4 - 78.4 (mg/dL)   GFR calc non  Af Amer 42 (*) >90 (mL/min)   GFR calc Af Amer 48 (*) >90 (mL/min)  TROPONIN I      Component Value Range   Troponin I <0.30  <0.30 (ng/mL)   Dg Chest Portable 1 View  10/10/2011  *RADIOLOGY REPORT*  Clinical Data: Shortness of breath and wheezing.  History of congestive heart failure.  PORTABLE CHEST - 1 VIEW  Comparison: Chest x-ray 11/16/2010.  Findings: Lung volumes are normal.  There is a moderate - severe enlargement of the cardiopericardial silhouette which appears increased compared to the prior examination, and has a "water bottle" appearance, that could suggest an underlying pericardial effusion.  The left base is poorly visualized, such that underlying atelectasis and/or consolidation in the left lower lobe is difficult to exclude.  There may also be a small or moderate left- sided pleural effusion (however, these findings may all simply reflect the enlarged cardiopericardial silhouette).  Mild pulmonary venous congestion without frank pulmonary edema.  Right lung is clear.  Mediastinal contours are unremarkable.  IMPRESSION: 1.  Moderate - severe enlargement of the cardiopericardial silhouette which has an appearance that could suggest the presence of an enlarging pericardial effusion. Clinical correlation is recommended, with consideration for further evaluation with echocardiography if clinically indicated. 2.  Because the enlarged cardiopericardial silhouette the left base is poorly evaluated.  There may be an area of atelectasis and/or consolidation in the left lower lobe, and there may also be a small to moderate left-sided pleural effusion.  This could be better evaluated with standing PA and lateral chest radiographs if clinically indicated.  Original Report Authenticated By: Florencia Reasons, M.D.    No diagnosis found.  1. CHF exacerbation 2. dyspnea  MDM  Regarding hgb of 8.3, patient has a long history of anemia,  slightly lower hbg than usual. She is symptomatically short of breath with elevated BNP. Feel she needs admission for symptoms, possibly an echo tomorrow with CXR with enlarged cardiac sillouette. Patient is comfortable on re-examination. Admit to [facility].       Rodena Medin, PA-C 10/10/11 2222

## 2011-10-11 DIAGNOSIS — I319 Disease of pericardium, unspecified: Secondary | ICD-10-CM

## 2011-10-11 LAB — CBC
Hemoglobin: 8.4 g/dL — ABNORMAL LOW (ref 12.0–15.0)
MCH: 25.4 pg — ABNORMAL LOW (ref 26.0–34.0)
MCV: 82.2 fL (ref 78.0–100.0)
Platelets: 259 10*3/uL (ref 150–400)
RBC: 3.31 MIL/uL — ABNORMAL LOW (ref 3.87–5.11)

## 2011-10-11 LAB — CARDIAC PANEL(CRET KIN+CKTOT+MB+TROPI)
CK, MB: 4 ng/mL (ref 0.3–4.0)
Relative Index: 3.7 — ABNORMAL HIGH (ref 0.0–2.5)
Relative Index: INVALID (ref 0.0–2.5)
Total CK: 107 U/L (ref 7–177)
Troponin I: <0.3 ng/mL (ref <0.30)
Troponin I: <0.3 ng/mL (ref <0.30)

## 2011-10-11 LAB — URINE MICROSCOPIC-ADD ON

## 2011-10-11 LAB — IRON AND TIBC
Saturation Ratios: 11 % — ABNORMAL LOW (ref 20–55)
TIBC: 268 ug/dL (ref 250–470)

## 2011-10-11 LAB — GLUCOSE, CAPILLARY
Glucose-Capillary: 193 mg/dL — ABNORMAL HIGH (ref 70–99)
Glucose-Capillary: 168 mg/dL — ABNORMAL HIGH (ref 70–99)
Glucose-Capillary: 104 mg/dL — ABNORMAL HIGH (ref 70–99)
Glucose-Capillary: 150 mg/dL — ABNORMAL HIGH (ref 70–99)

## 2011-10-11 LAB — URINALYSIS, ROUTINE W REFLEX MICROSCOPIC
Bilirubin Urine: NEGATIVE
Glucose, UA: NEGATIVE mg/dL
Hgb urine dipstick: NEGATIVE
Protein, ur: NEGATIVE mg/dL

## 2011-10-11 LAB — BASIC METABOLIC PANEL
CO2: 29 mEq/L (ref 19–32)
Calcium: 9.2 mg/dL (ref 8.4–10.5)
Glucose, Bld: 123 mg/dL — ABNORMAL HIGH (ref 70–99)
Sodium: 140 mEq/L (ref 135–145)

## 2011-10-11 LAB — FERRITIN: Ferritin: 51 ng/mL (ref 10–291)

## 2011-10-11 MED ORDER — FUROSEMIDE 10 MG/ML IJ SOLN
40.0000 mg | Freq: Two times a day (BID) | INTRAMUSCULAR | Status: DC
  Administered 2011-10-11 – 2011-10-12 (×2): 40 mg via INTRAVENOUS
  Filled 2011-10-11 (×3): qty 4

## 2011-10-11 MED ORDER — INSULIN DETEMIR 100 UNIT/ML ~~LOC~~ SOLN
10.0000 [IU] | Freq: Every day | SUBCUTANEOUS | Status: DC
  Filled 2011-10-11: qty 10

## 2011-10-11 MED ORDER — INSULIN DETEMIR 100 UNIT/ML ~~LOC~~ SOLN
20.0000 [IU] | Freq: Every day | SUBCUTANEOUS | Status: DC
  Administered 2011-10-11: 20 [IU] via SUBCUTANEOUS
  Filled 2011-10-11: qty 10

## 2011-10-11 MED ORDER — INSULIN ASPART 100 UNIT/ML ~~LOC~~ SOLN
0.0000 [IU] | Freq: Three times a day (TID) | SUBCUTANEOUS | Status: DC
Start: 2011-10-11 — End: 2011-10-12

## 2011-10-11 MED ORDER — ENOXAPARIN SODIUM 40 MG/0.4ML ~~LOC~~ SOLN
40.0000 mg | SUBCUTANEOUS | Status: DC
  Administered 2011-10-11 – 2011-10-12 (×2): 40 mg via SUBCUTANEOUS
  Filled 2011-10-11 (×2): qty 0.4

## 2011-10-11 MED ORDER — LORATADINE 10 MG PO TABS
10.0000 mg | ORAL_TABLET | Freq: Every day | ORAL | Status: DC
  Administered 2011-10-11 – 2011-10-12 (×2): 10 mg via ORAL
  Filled 2011-10-11 (×2): qty 1

## 2011-10-11 NOTE — Progress Notes (Signed)
  Echocardiogram 2D Echocardiogram has been performed.  Cathie Beams Deneen 10/11/2011, 1:06 PM

## 2011-10-11 NOTE — Evaluation (Signed)
Physical Therapy Evaluation Patient Details Name: XITLALLI NEWHARD MRN: [id number] DOB: January 01, 1946 Today's Date: 10/11/2011  Problem List:  Patient Active Problem List  Diagnoses  . Type II or unspecified type diabetes mellitus with ophthalmic manifestations, uncontrolled  . DYSLIPIDEMIA  . ANEMIA  . CARPAL TUNNEL SYNDROME, BILATERAL  . Proliferative diabetic retinopathy  . HYPERTENSION  . CAD  . DIASTOLIC HEART FAILURE, CHRONIC  . KNEE PAIN, BILATERAL  . BACK PAIN, LUMBAR  . Palpitations  . OTHER DYSPNEA AND RESPIRATORY ABNORMALITIES  . NONSPECIFIC ABNORMAL UNSPEC CV FUNCTION STUDY  . Kidney stone on left side    Past Medical History:  Past Medical History  Diagnosis Date  . Proliferative diabetic retinopathy   . CAD 10/2008 cath    [facility] with diffuse small branch CAD  . DIASTOLIC HEART FAILURE, CHRONIC     echo 10/2008  . Arthritis     R>L knee, declines TKR  . ANEMIA   . Diabetes mellitus, type 2     uncontrolled, med noncompliance  . DYSLIPIDEMIA   . HYPERTENSION   . CARPAL TUNNEL SYNDROME, BILATERAL    Past Surgical History:  Past Surgical History  Procedure Date  . Abdominal hysterectomy     PT Assessment/Plan/Recommendation PT Assessment Clinical Impression Statement: Pt admitted for SOB possibly 2* to CHF exacerbation or possible pericardial effusion.  Pt would benefit from acute PT services in order to improve independence and safety with transfers and ambulation as well as improve activity tolerance as pt fatigues easily with exertion. PT Recommendation/Assessment: Patient will need skilled PT in the acute care venue PT Problem List: Decreased activity tolerance;Decreased mobility;Decreased knowledge of use of DME;Decreased safety awareness;Cardiopulmonary status limiting activity PT Therapy Diagnosis : Difficulty walking PT Plan PT Frequency: Min 3X/week PT Treatment/Interventions: DME instruction;Gait training;Functional mobility training;Stair  training;Therapeutic activities;Therapeutic exercise;Balance training;Patient/family education PT Recommendation Recommendations for Other Services: OT consult Follow Up Recommendations: Home health PT Equipment Recommended: Cane PT Goals  Acute Rehab PT Goals PT Goal Formulation: With patient Time For Goal Achievement: 2 weeks Pt will go Supine/Side to Sit: with modified independence PT Goal: Supine/Side to Sit - Progress: Goal set today Pt will go Sit to Stand: with modified independence PT Goal: Sit to Stand - Progress: Goal set today Pt will go Stand to Sit: with modified independence PT Goal: Stand to Sit - Progress: Goal set today Pt will Ambulate: >150 feet;with modified independence;with least restrictive assistive device PT Goal: Ambulate - Progress: Goal set today Pt will Go Up / Down Stairs: 3-5 stairs;with rail(s);with least restrictive assistive device;with modified independence PT Goal: Up/Down Stairs - Progress: Goal set today Pt will Perform Home Exercise Program: with supervision, verbal cues required/provided PT Goal: Perform Home Exercise Program - Progress: Goal set today  PT Evaluation Precautions/Restrictions  Precautions Precautions: Fall Restrictions Weight Bearing Restrictions: No Prior Functioning  Home Living Lives With: Son;Other (Comment) (son lives with pt ) Type of Home: House Home Access: Stairs to enter Secretary/administrator of Steps: 3 Entrance Stairs-Rails: Left Home Layout: One level Bathroom Shower/Tub: Tub/shower unit Home Adaptive Equipment: Bathtub lift Additional Comments: pt reports bathtub lift is not working 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   Sensation/Coordination   Extremity Assessment RLE Assessment RLE Assessment: Within Functional Limits RLE Strength RLE Overall Strength Comments: grossly at least 3+/5 throughout LLE Assessment LLE Assessment: Within Functional Limits LLE Strength LLE  Overall Strength Comments: grossly at least 3+/5 throughout Mobility (including Balance) Bed Mobility Bed Mobility: Yes Supine  to Sit: 5: Supervision;With rails;HOB elevated (Comment degrees) Supine to Sit Details (indicate cue type and reason): increased time Transfers Transfers: Yes Sit to Stand: 4: Min assist;With upper extremity assist;From bed Sit to Stand Details (indicate cue type and reason): verbal cues for hand placement, pt given one HHA Stand to Sit: 4: Min assist;To chair/3-in-1;With armrests Stand to Sit Details: min/guard, use of armrest Ambulation/Gait Ambulation/Gait: Yes Ambulation/Gait Assistance: 4: Min assist Ambulation/Gait Assistance Details (indicate cue type and reason): min/guard, pt declined RW, 1 HHA pt did put weight through hand but reports HHA for decreased vision in unfamiliar environment Ambulation Distance (Feet): 80 Feet Assistive device: 1 person hand held assist Gait Pattern: Step-through pattern;Decreased stride length Gait velocity: decreased    Exercise    End of Session PT - End of Session Activity Tolerance: Patient limited by fatigue Patient left: in chair;with call bell in reach;with family/visitor present General Behavior During Session: [facility] for tasks performed Cognition: [facility] for tasks performed  Kiaria Quinnell,KATHrine E 10/11/2011, 11:34 AM Pager: [phone]

## 2011-10-11 NOTE — Progress Notes (Signed)
Subjective: Feeling better and breathing better; still with some congestion feelings in her nose and upper resp airway; no CP and no fever. Patient reports improved in her orthopnea.  Objective: Vital signs in last 24 hours: Temp:  [98 F (36.7 C)-98.9 F (37.2 C)] 98.5 F (36.9 C) (04/12 1358) Pulse Rate:  [65-76] 71  (04/12 1358) Resp:  [16-23] 18  (04/12 1358) BP: (124-211)/(64-89) 124/68 mmHg (04/12 1358) SpO2:  [95 %-97 %] 97 % (04/12 1358) Weight:  [90.719 kg (200 lb)-93.1 kg (205 lb 4 oz)] 93.1 kg (205 lb 4 oz) (04/12 0121) Weight change:  Last BM Date: 10/09/11  Intake/Output from previous day: 04/11 0701 - 04/12 0700 In: 250 [IV Piggyback:250] Out: 300 [Urine:300] Total I/O In: 120 [P.O.:120] Out: -    Physical Exam: General: Alert, awake, oriented x3, in no acute distress. HEENT: No bruits, no goiter. Heart: Regular rate and rhythm, without murmurs, rubs, gallops. Lungs: Clear to auscultation bilaterally. Abdomen: Soft, nontender, nondistended, positive bowel sounds. Extremities: No clubbing cyanosis or edema with positive pedal pulses. Neuro: Grossly intact, nonfocal.  Lab Results: Basic Metabolic Panel:  Basename 10/11/11 0635 10/10/11 2250 10/10/11 2057  NA 140 -- 139  K 3.7 -- 3.6  CL 102 -- 100  CO2 29 -- 29  GLUCOSE 123* -- 285*  BUN 23 -- 21  CREATININE 1.37* -- 1.31*  CALCIUM 9.2 -- 9.1  MG -- 2.0 --  PHOS -- 3.5 --   Liver Function Tests:  Basename 10/10/11 2250  AST 13  ALT 9  ALKPHOS 89  BILITOT 0.2*  PROT 7.4  ALBUMIN 3.2*   CBC:  Basename 10/11/11 0635 10/10/11 2057  WBC 9.8 8.9  NEUTROABS -- 6.3  HGB 8.4* 8.3*  HCT 27.2* 26.4*  MCV 82.2 81.2  PLT 259 292   Cardiac Enzymes:  Basename 10/11/11 1358 10/11/11 0635 10/10/11 2250  CKTOTAL 92 107 113  CKMB 4.2* 4.0 3.1  CKMBINDEX -- -- --  TROPONINI <0.30 <0.30 <0.30   BNP:  Basename 10/10/11 2057  PROBNP 2354.0*   CBG:  Basename 10/11/11 1101 10/11/11 0806 10/11/11  0406 10/11/11 0008  GLUCAP 168* 193* 170* 183*   Hemoglobin A1C:  Basename 10/10/11 2250  HGBA1C 10.9*   Thyroid Function Tests:  Basename 10/10/11 2250  TSH 2.036  T4TOTAL --  FREET4 --  T3FREE --  THYROIDAB --   Anemia Panel:  Basename 10/10/11 2250  VITAMINB12 435  FOLATE 13.9  FERRITIN 51  TIBC 268  IRON 29*  RETICCTPCT 1.9   Urinalysis:  Basename 10/10/11 2328  COLORURINE YELLOW  LABSPEC 1.010  PHURINE 7.0  GLUCOSEU NEGATIVE  HGBUR NEGATIVE  BILIRUBINUR NEGATIVE  KETONESUR NEGATIVE  PROTEINUR NEGATIVE  UROBILINOGEN 0.2  NITRITE NEGATIVE  LEUKOCYTESUR SMALL*    Studies/Results: Dg Chest Portable 1 View  10/10/2011  *RADIOLOGY REPORT*  Clinical Data: Shortness of breath and wheezing.  History of congestive heart failure.  PORTABLE CHEST - 1 VIEW  Comparison: Chest x-ray 11/16/2010.  Findings: Lung volumes are normal.  There is a moderate - severe enlargement of the cardiopericardial silhouette which appears increased compared to the prior examination, and has a "water bottle" appearance, that could suggest an underlying pericardial effusion.  The left base is poorly visualized, such that underlying atelectasis and/or consolidation in the left lower lobe is difficult to exclude.  There may also be a small or moderate left- sided pleural effusion (however, these findings may all simply reflect the enlarged cardiopericardial silhouette).  Mild pulmonary  venous congestion without frank pulmonary edema.  Right lung is clear.  Mediastinal contours are unremarkable.  IMPRESSION: 1.  Moderate - severe enlargement of the cardiopericardial silhouette which has an appearance that could suggest the presence of an enlarging pericardial effusion. Clinical correlation is recommended, with consideration for further evaluation with echocardiography if clinically indicated. 2.  Because the enlarged cardiopericardial silhouette the left base is poorly evaluated.  There may be an area of  atelectasis and/or consolidation in the left lower lobe, and there may also be a small to moderate left-sided pleural effusion.  This could be better evaluated with standing PA and lateral chest radiographs if clinically indicated.  Original Report Authenticated By: Florencia Reasons, M.D.    Medications: Scheduled Meds:   . amLODipine  10 mg Oral QODAY   And  . atorvastatin  80 mg Oral QODAY  . aspirin  325 mg Oral Daily  . carvedilol  25 mg Oral BID WC  . enoxaparin  40 mg Subcutaneous Q24H  . furosemide  40 mg Intravenous BID  . furosemide  80 mg Intravenous Once  . glyBURIDE  5 mg Oral BID WC  . insulin aspart  0-15 Units Subcutaneous Q4H  . insulin detemir  10 Units Subcutaneous QHS  . linagliptin  5 mg Oral Daily  . lisinopril  20 mg Oral Daily  . loratadine  10 mg Oral Daily  . metFORMIN  1,000 mg Oral BID WC  . sodium chloride  3 mL Intravenous Q12H  . sodium chloride  3 mL Intravenous Q12H  . DISCONTD: amLODipine-atorvastatin  1 tablet Oral Q48H  . DISCONTD: azithromycin  500 mg Intravenous Q24H  . DISCONTD: enoxaparin  30 mg Subcutaneous Q24H  . DISCONTD: furosemide  40 mg Intravenous Daily  . DISCONTD: furosemide  40 mg Intravenous Daily  . DISCONTD: glyBURIDE-metformin  2 tablet Oral BID WC   Continuous Infusions:  PRN Meds:.sodium chloride, HYDROcodone-acetaminophen, ondansetron (ZOFRAN) IV, ondansetron, sodium chloride  Assessment/Plan: 1-SOB and orthopnea: significantly improved with diuresis; will continue lasix but will change to PO BID; CE'z essentially negative and patient w/o CP. No fever or elevation on her WBC's; at this point will discontinue antibiotics and will follow 2-D echo results before discharging her. Will also repeat CXR PA and Lat in am.  2-CHF: appears to be diastolic in nature; will follow 2-D echo and continue diuresis; patient symptoms improved with lasix.  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 Will start iron pill at discharge.  4-Renal failure: appears to be acute in nature; due to decreased perfusion in setting of CHF; will continue lasix for now; but will change to PO. BMET in am  5-DM:Continue SSI, trajednta and start levemir. Will discontinue metformin due to ARF.  6-HTN: now stable; will continue current medication regimen.  7-DVT: lovenox.    LOS: 1 day   Ilaisaane Marts [facility] [phone]  10/11/2011, 3:49 PM

## 2011-10-11 NOTE — Progress Notes (Signed)
Pt lives at home with her son.  He was recently injured and now has limited function of his hand.  With pt's decreased vision, she is inquiring about help around the house while her son is unable to do so.

## 2011-10-11 NOTE — Plan of Care (Signed)
Problem: Food- and Nutrition-Related Knowledge Deficit (NB-1.1) Goal: Nutrition education Formal process to instruct or train a patient/client in a skill or to impart knowledge to help patients/clients voluntarily manage or modify food choices and eating behavior to maintain or improve health.  Outcome: Completed/Met Date Met:  10/11/11 Met with pt to review nutrition therapy for heart failure. Pt states she has been trying to watch her sodium intake at home, however admits to eating high sodium foods like bacon and adding a "dusting" of salt to foods. Encouraged pt to avoid adding salt to any foods and to cook with Mrs. Dash and/or other spices that do not contain sodium. Discussed higher sodium foods and encouraged pt to limit their intake. Pt aware of relationship between sodium intake and fluid weight gain which pt reports she has been having. Provided handout of CHF diet information. Pt expressed understanding.

## 2011-10-11 NOTE — Progress Notes (Signed)
CARE MANAGEMENT NOTE 10/11/2011  Patient:  Judith Shepherd, Judith Shepherd   Account Number:  [id number]  Date Initiated:  10/11/2011  Documentation initiated by:  Shalaine Payson  Subjective/Objective Assessment:   pt with hx of chf and now increased dyspnea and congestion despite op treatment, lives at home with son.     Action/Plan:   Son has recently been injured and unable to help with patients care may need short term rehab or snf placement for safety   Anticipated DC Date:  10/14/2011   Anticipated DC Plan:  SKILLED NURSING FACILITY  In-house referral  NA      DC Planning Services  NA      [facility] Choice  NA   Choice offered to / List presented to:  NA   DME arranged  NA      DME agency  NA     HH arranged  NA      HH agency  NA   Status of service:  In process, will continue to follow Medicare Important Message given?  NA - LOS <3 / Initial given by admissions (If response is "NO", the following Medicare IM given date fields will be blank) Date Medicare IM given:   Date Additional Medicare IM given:    Discharge Disposition:    Per UR Regulation:  Reviewed for med. necessity/level of care/duration of stay  If discussed at Long Length of Stay Meetings, dates discussed:    Comments:  [id number]/Zada Haser Lorrin Mais Case Management [phone]

## 2011-10-11 NOTE — Progress Notes (Signed)
Inpatient Diabetes Program Recommendations  AACE/ADA: New Consensus Statement on Inpatient Glycemic Control (2009)  Target Ranges:  Prepandial:   less than 140 mg/dL      Peak postprandial:   less than 180 mg/dL (1-2 hours)      Critically ill patients:  140 - 180 mg/dL   Reason for Visit: Elevated HgbA1C  Results for THREASA, KINCH (MRN [id number]) as of 10/11/2011 13:45  Ref. Range 09/16/2011 09:48 10/10/2011 22:50  Hemoglobin A1C Latest Range: <5.7 % 11.3 (H) 10.9 (H)  Results for ADALINE, TREJOS (MRN [id number]) as of 10/11/2011 13:45  Ref. Range 10/11/2011 00:08 10/11/2011 04:06 10/11/2011 08:06 10/11/2011 11:01  Glucose-Capillary Latest Range: 70-99 mg/dL 782 (H) 956 (H) 213 (H) 168 (H)    Inpatient Diabetes Program Recommendations Insulin - Basal: Add Levemir 10 units QD  Note: Pt states she just started on Levemir 10 units last week by Dr. Everardo All. "I'm working on getting my blood sugars in control." Discussed importance of improving HgbA1C.  Will continue to follow.

## 2011-10-12 ENCOUNTER — Inpatient Hospital Stay (HOSPITAL_COMMUNITY): Payer: Medicare Other

## 2011-10-12 LAB — CBC
HCT: 25.7 % — ABNORMAL LOW (ref 36.0–46.0)
MCHC: 31.9 g/dL (ref 30.0–36.0)
Platelets: 276 10*3/uL (ref 150–400)
RDW: 14.6 % (ref 11.5–15.5)
WBC: 7.2 10*3/uL (ref 4.0–10.5)

## 2011-10-12 LAB — BASIC METABOLIC PANEL
BUN: 29 mg/dL — ABNORMAL HIGH (ref 6–23)
Chloride: 103 mEq/L (ref 96–112)
Creatinine, Ser: 1.67 mg/dL — ABNORMAL HIGH (ref 0.50–1.10)
GFR calc Af Amer: 36 mL/min — ABNORMAL LOW (ref >90)
GFR calc non Af Amer: 31 mL/min — ABNORMAL LOW (ref >90)

## 2011-10-12 MED ORDER — INSULIN DETEMIR 100 UNIT/ML ~~LOC~~ SOLN: 20.0000 [IU] | Freq: Every day | SUBCUTANEOUS | Status: DC

## 2011-10-12 MED ORDER — FERROUS SULFATE 325 (65 FE) MG PO TABS: 325.0000 mg | ORAL_TABLET | Freq: Three times a day (TID) | ORAL | Status: AC

## 2011-10-12 MED ORDER — POTASSIUM CHLORIDE 10 MEQ PO TBCR: 20.0000 meq | EXTENDED_RELEASE_TABLET | Freq: Every day | ORAL | Status: DC

## 2011-10-12 MED ORDER — FUROSEMIDE 40 MG PO TABS: 40.0000 mg | ORAL_TABLET | Freq: Two times a day (BID) | ORAL | Status: DC

## 2011-10-12 MED ORDER — GLYBURIDE 5 MG PO TABS: 5.0000 mg | ORAL_TABLET | Freq: Two times a day (BID) | ORAL | Status: DC

## 2011-10-12 MED ORDER — LORATADINE 10 MG PO TABS: 10.0000 mg | ORAL_TABLET | Freq: Every day | ORAL | Status: DC

## 2011-10-12 NOTE — Progress Notes (Signed)
D/c home accomp by daughter. All d/c instructions reviewed.  Reinforced teaching again regarding med compliance, diet, wt daily, and when to call M.D.,. Pt calm, eager to learn. D/ C in good spirits with no concerns voiced.

## 2011-10-12 NOTE — Progress Notes (Signed)
This AM's BMet shows blood glucose of 63.  Pt states she was feeling a bit nauseous and hungry earlier and ate a snack of cheese and fruit; now feels better.  CBG checked, 112 result at present time.  Denies discomfort.  Has just placed her breakfast order.  Reinstructed to call if has symptoms of hypoglycemia; verbalizes understanding.

## 2011-10-12 NOTE — Care Management Note (Addendum)
Cm spoke with pt concerning dc planning. Md order for HHPT/RN. Per pt choice [facility] to provide [facility] services.CM spoke with intake RN April at [facility] concerning new pt referral. Start of care expected 10/14/11. Md order, H&P, demographics faxed to [phone]. Pt request cane. [facility] to provide cane to residence upon pt's discharge. Pt state required scale. Cm provided pt with scale voucher. Pt's adult daughters present at bedside to provide home assistance.    Judith Shepherd [phone]

## 2011-10-12 NOTE — Discharge Instructions (Signed)
Heart Failure Heart failure (HF) is a condition in which the heart has trouble pumping blood. This means your heart does not pump blood efficiently for your body to work well. In some cases of HF, fluid may back up into your lungs or you may have swelling (edema) in your lower legs. HF is a long-term (chronic) condition. It is important for you to take good care of yourself and follow your caregiver's treatment plan. CAUSES   Health conditions:   High blood pressure (hypertension) causes the heart muscle to work harder than normal. When pressure in the blood vessels is high, the heart needs to pump (contract) with more force in order to circulate blood throughout the body. High blood pressure eventually causes the heart to become stiff and weak.   Coronary artery disease (CAD) is the buildup of cholesterol and fat (plaques) in the arteries of the heart. The blockage in the arteries deprives the heart muscle of oxygen and blood. This can cause chest pain and may lead to a heart attack. High blood pressure can also contribute to CAD.   Heart attack (myocardial infarction) occurs when 1 or more arteries in the heart become blocked. The loss of oxygen damages the muscle tissue of the heart. When this happens, part of the heart muscle dies. The injured tissue does not contract as well and weakens the heart's ability to pump blood.   Abnormal heart valves can cause HF when the heart valves do not open and close properly. This makes the heart muscle pump harder to keep the blood flowing.   Heart muscle disease (cardiomyopathy or myocarditis) is damage to the heart muscle from a variety of causes. These can include drug or alcohol abuse, infections, or unknown reasons. These can increase the risk of HF.   Lung disease makes the heart work harder because the lungs do not work properly. This can cause a strain on the heart leading it to fail.   Diabetes increases the risk of HF. High blood sugar contributes  to high fat (lipid) levels in the blood. Diabetes can also cause slow damage to tiny blood vessels that carry important nutrients to the heart muscle. When the heart does not get enough oxygen and food, it can cause the heart to become weak and stiff. This leads to a heart that does not contract efficiently.   Other diseases can contribute to HF. These include abnormal heart rhythms, thyroid problems, and low blood counts (anemia).   Unhealthy lifestyle habits:   Obesity.   Smoking.   Eating foods high in fat and cholesterol.   Eating or drinking beverages high in salt.   Drug or alcohol abuse.   Lack of exercise.  SYMPTOMS  HF symptoms may vary and can be hard to detect. Symptoms may include:  Shortness of breath with activity, such as climbing stairs.   Persistent cough.   Swelling of the feet, ankles, legs, or abdomen.   Unexplained weight gain.   Difficulty breathing when lying flat.   Waking from sleep because of the need to sit up and get more air.   Rapid heartbeat.   Fatigue and loss of energy.   Feeling lightheaded or close to fainting.  DIAGNOSIS  A diagnosis of HF is based on your history, symptoms, physical examination, and diagnostic tests. Diagnostic tests for HF may include:  EKG.   Chest X-ray.   Blood tests.   Exercise stress test.   Blood oxygen test (arterial blood gas).   Evaluation   by a heart doctor (cardiologist).   Ultrasound evaluation of the heart (echocardiogram).   Heart artery test to look for blockages (angiogram).   Radioactive imaging to look at the heart (radionuclide test).  TREATMENT  Treatment is aimed at managing the symptoms of HF. Medicines, lifestyle changes, or surgical intervention may be necessary to treat HF.  Medicines to help treat HF may include:   Angiotensin-converting enzyme (ACE) inhibitors. These block the effects of a blood protein called angiotensin-converting enzyme. ACE inhibitors relax (dilate) the  blood vessels and help lower blood pressure. This decreases the workload of the heart, slows the progression of HF, and improves symptoms.   Angiotensin receptor blockers (ARBs). These medications work similar to ACE inhibitors. ARBs may be an alternative for people who cannot tolerate an ACE inhibitor.   Aldosterone antagonists. This medication helps get rid of extra fluid from your body. This lowers the volume of blood the heart has to pump.   Water pills (diuretics). Diuretics cause the kidneys to remove salt and water from the blood. The extra fluid is removed by urination. By removing extra fluid from the body, diuretics help lower the workload of the heart and help prevent fluid buildup in the lungs so breathing is easier.   Beta blockers. These prevent the heart from beating too fast and improve heart muscle strength. Beta blockers help maintain a normal heart rate, control blood pressure, and improve HF symptoms.   Digitalis. This increases the force of the heartbeat and may be helpful to people with HF or heart rhythm problems.   Healthy lifestyle changes include:   Stopping smoking.   Eating a healthy diet. Avoid foods high in fat. Avoid foods fried in oil or made with fat. A dietician can help with healthy food choices.   Limiting how much salt you eat.   Limiting alcohol intake to no more than 1 drink per day for women and 2 drinks per day for men. Drinking more than that is harmful to your heart. If your heart has already been damaged by alcohol or you have severe HF, drinking alcohol should be stopped completely.   Exercising as directed by your caregiver.   Surgical treatment for HF may include:   Procedures to open blocked arteries, repair damaged heart valves, or remove damaged heart muscle tissue.   A pacemaker to help heart muscle function and to control certain abnormal heart rhythms.   A defibrillator to possibly prevent sudden cardiac death.  HOME CARE  INSTRUCTIONS   Activity level. Your caregiver can help you determine what type of exercise program may be helpful. It is important to maintain your strength. Pace your physical activity to avoid shortness of breath or chest pain. Rest for 1 hour before and after meals. A cardiac rehabilitation program may be helpful to some people with HF.   Diet. Eat a heart healthy diet. Food choices should be low in saturated fat and cholesterol. Talk to a dietician to learn about heart healthy foods.   Salt intake. When you have HF, you need to limit the amount of salt you eat. Eat less than 1500 milligrams (mg) of salt per day or as recommended by your caregiver.   Weight monitoring. Weigh yourself every day. You should weigh yourself in the morning after you urinate and before you eat breakfast. Wear the same amount of clothing each time you weigh yourself. Record your weight daily. Bring your recorded weights to your clinic visits. Tell your caregiver right away if   you have gained 3 lb/1.4 kg in 1 day, or 5 lb/2.3 kg in a week or whatever amount you were told to report.   Blood pressure monitoring. This should be done as directed by your caregiver. A home blood pressure cuff can be purchased at a drugstore. Record your blood pressure numbers and bring them to your clinic visits. Tell your caregiver if you become dizzy or lightheaded upon standing up.   Smoking. If you are currently a smoker, it is time to quit. Nicotine makes your heart work harder by causing your blood vessels to constrict. Do not use nicotine gum or patches before talking to your caregiver.   Follow up. Be sure to schedule a follow-up visit with your caregiver. Keep all your appointments.  SEEK MEDICAL CARE IF:   Your weight increases by 3 lb/1.4 kg in 1 day or 5 lb/2.3 kg in a week.   You notice increasing shortness of breath that is unusual for you. This may happen during rest, sleep, or with activity.   You cough more than normal,  especially with physical activity.   You notice more swelling in your hands, feet, ankles, or belly (abdomen).   You are unable to sleep because it is hard to breathe.   You cough up bloody mucus (sputum).   You begin to feel "jumping" or "fluttering" sensations (palpitations) in your chest.  SEEK IMMEDIATE MEDICAL CARE IF:   You have severe chest pain or pressure which may include symptoms such as:   Pain or pressure in the arms, neck, jaw, or back.   Feeling sweaty.   Feeling sick to your stomach (nauseous).   Feeling short of breath while at rest.   Having a fast or irregular heartbeat.   You experience stroke symptoms. These symptoms include:   Facial weakness or numbness.   Weakness or numbness in an arm, leg, or on one side of your body.   Blurred vision.   Difficulty talking or thinking.   Dizziness or fainting.   Severe headache.  THESE ARE MEDICAL EMERGENCIES. Do not wait to see if the symptoms go away. Call your local emergency services (911 in [place] DO NOT drive yourself to the hospital. IMPORTANT  Make a list of every medicine, vitamin, or herbal supplement you are taking. Keep the list with you at all times. Show it to your caregiver at every visit. Keep the list up-to-date.   Ask your caregiver or pharmacist to write an explanation of each medicine you are taking. This should include:   Why you are taking it.   The possible side effects.   The best time of day to take it.   Foods to take with it or what foods to avoid.   When to stop taking it.  MAKE SURE YOU:   Understand these instructions.   Will watch your condition.   Will get help right away if you are not doing well or get worse.

## 2011-10-12 NOTE — Discharge Summary (Signed)
Physician Discharge Summary  Patient ID: Judith Shepherd MRN: [id number] DOB/AGE: 12/17/1945 66 y.o.  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      aspirin 325 MG tablet   Take 325 mg by mouth daily.      carvedilol 25 MG tablet   Commonly known as: COREG   Take 1 tablet (25 mg total) by mouth 2 (two) times daily with a meal.      furosemide 40 MG tablet   Commonly known as: LASIX   Take 1 tablet (40 mg total) by mouth 2 (two) times daily.      glyBURIDE 5 MG tablet   Commonly known as: DIABETA   Take 1 tablet (5 mg total) by mouth 2 (two) times daily with a meal.      insulin detemir 100 UNIT/ML injection   Commonly known as: LEVEMIR   Inject 20 Units into the skin at bedtime.      lisinopril 20 MG tablet   Commonly known as: PRINIVIL,ZESTRIL   TAKE ONE TABLET BY MOUTH EVERY DAY      loratadine 10 MG tablet   Commonly known as: CLARITIN   Take 1 tablet (10 mg total) by mouth daily.      potassium chloride 10 MEQ CR tablet   Commonly known as: KLOR-CON   Take 2 tablets (20 mEq total) by mouth daily.      sitaGLIPtin 100 MG tablet   Commonly known as: JANUVIA   Take 100 mg by mouth daily.      traMADol 50 MG tablet   Commonly known as: ULTRAM   TAKE ONE TABLET BY MOUTH EVERY 6 HOURS AS NEEDED FOR PAIN             Disposition and Follow-up: Patient  discharge home in stable and improved condition; at this point she has been advised to follow a low sodium diet (<2 grams daily). Lasix will be BID 40mg  and medication compliance has been counseled. Patient will follow with cardiology as per their recommendations for a repeat 2-Echo in 6 weeks and to have his meds re-adjusted as needed. She will also follow in 2 weeks with her PCP in order to follow kidney function and blood sugar, and to provide further adjustment on her chronic medical problems.   Consults:   [place] [facility] consulted (Dr. Eden Emms)   Significant Diagnostic Studies:  Dg Chest Portable 1 View  10/10/2011  *RADIOLOGY REPORT*  Clinical Data: Shortness of breath and wheezing.  History of congestive heart failure.  PORTABLE CHEST - 1 VIEW  Comparison: Chest x-ray 11/16/2010.  Findings: Lung volumes are normal.  There is a moderate - severe enlargement of the cardiopericardial silhouette which appears increased compared to the prior examination, and has a "water bottle" appearance, that could suggest an underlying pericardial  effusion.  The left base is poorly visualized, such that underlying atelectasis and/or consolidation in the left lower lobe is difficult to exclude.  There may also be a small or moderate left- sided pleural effusion (however, these findings may all simply reflect the enlarged cardiopericardial silhouette).  Mild pulmonary venous congestion without frank pulmonary edema.  Right lung is clear.  Mediastinal contours are unremarkable.  IMPRESSION: 1.  Moderate - severe enlargement of the cardiopericardial silhouette which has an appearance that could suggest the presence of an enlarging pericardial effusion. Clinical correlation is recommended, with consideration for further evaluation with echocardiography if clinically indicated. 2.  Because the enlarged cardiopericardial silhouette the left base is poorly evaluated.  There may be an area of atelectasis and/or consolidation  in the left lower lobe, and there may also be a small to moderate left-sided pleural effusion.  This could be better evaluated with standing PA and lateral chest radiographs if clinically indicated.  Original Report Authenticated By: Florencia Reasons, M.D.    Brief H and P: 66 yo female with history of CHF and last 2 D ECHO done on 2010 with EF of 65%, now presents to [facility] ED with main concern of progressively worsening shortness of breath that started several days prior to admission and associated with 3- pillow orthopnea. Pt also reports intermittently productive cough of yellowish sputum, with no specific alleviating or aggravating factors. Pt reports similar episodes in the past and tells me she thinks it is related to her CHF. She denies chest pain other than the one associated with cough, no fevers, no chills, no specific abdominal or urinary concerns, no specific focal neurological weakness, no visual changes and no headache. No other systemic symptoms.   Hospital Course:  1-SOB and orthopnea: significantly improved with diuresis; associated with acute exacerbation of her CHF. Most likely associated with diet indiscretion and the fact patient reports missing some of her lasix doses. She was advised on medication compliance; low sodium diet and will follow with PCP in 2 weeks and cardiologist in 6 weeks, respectively.  2-CHF diastolic (grade 2): Will continue lasix Twice a day; low sodium diet and risk factor modifications.   3-AOCD: close to baseline; further work up as per PCP as an outpatient; no transfusion required and her FOBT was negative. Iron were was also low; will start ferrous sulfate BID.   4-Acute on chronic renal failure (stage 2): due to decreased perfusion in setting of CHF; will continue lasix BId; patient to follow with PCP for follow up of her CKD. Metformin was stopped due to elevated creatinine.   5-DM:Continue januvia, continue glyburide and now levemir 20 units daily;  further adjustments per PCP.   6-HTN: stable; will continue current medication regimen  7-HLD: continue statins.  8-Allergic rhinitis: started on loratadine daily.  9-Pericardial effusion: per cardiology recommendations and response to lasix; she will continue lasix BID; continue low sodium diet and the plan is to repeat 2-D echo in 6 weeks. Patient now no SOB.   Time spent on Discharge: 40 minutes  Signed: Lando Alcalde 10/12/2011, 11:46 AM

## 2011-10-14 ENCOUNTER — Telehealth: Payer: Self-pay | Admitting: *Deleted

## 2011-10-14 ENCOUNTER — Other Ambulatory Visit: Payer: Self-pay | Admitting: *Deleted

## 2011-10-14 DIAGNOSIS — I313 Pericardial effusion (noninflammatory): Secondary | ICD-10-CM

## 2011-10-14 NOTE — Telephone Encounter (Signed)
This patient needs followup with me ----- Message ----- From: Wendall Stade, MD Sent: 10/12/2011 10:22 AM To: Laurey Morale, MD, Jacqlyn Krauss, RN  D/C from hospital Sunday Echo ? Amyloid and has pericardial effusion that needs F/U. Please schedule F/U with DM 2-3 weeks and echo in 6-8 weeks  10/14/11--I will forward to [place] in scheduling to call pt to schedule. Order for echo placed in Epic.

## 2011-10-17 ENCOUNTER — Telehealth: Payer: Self-pay | Admitting: Cardiology

## 2011-10-17 ENCOUNTER — Other Ambulatory Visit: Payer: Self-pay | Admitting: *Deleted

## 2011-10-17 NOTE — Telephone Encounter (Signed)
Spoke with pt. She is aware that she needs  echo in 6-8 weeks. She needs a post hospital appt in about 2 weeks. Dr Shirlee Latch said it could be scheduled with Lorin Picket on a day he  is in the office.

## 2011-10-21 ENCOUNTER — Ambulatory Visit (INDEPENDENT_AMBULATORY_CARE_PROVIDER_SITE_OTHER): Payer: Medicare Other | Admitting: Endocrinology

## 2011-10-21 ENCOUNTER — Encounter: Payer: Self-pay | Admitting: Endocrinology

## 2011-10-21 VITALS — BP 152/84 | HR 85 | Temp 99.5°F | Ht 66.0 in | Wt 203.1 lb

## 2011-10-21 DIAGNOSIS — E1139 Type 2 diabetes mellitus with other diabetic ophthalmic complication: Secondary | ICD-10-CM

## 2011-10-21 DIAGNOSIS — H579 Unspecified disorder of eye and adnexa: Secondary | ICD-10-CM

## 2011-10-21 DIAGNOSIS — E1165 Type 2 diabetes mellitus with hyperglycemia: Secondary | ICD-10-CM

## 2011-10-21 NOTE — Patient Instructions (Addendum)
change levemir to 25 units each morning.   Stop glyburide and januvia.   check your blood sugar 2 times a day.  vary the time of day when you check, between before the 3 meals, and at bedtime.  also check if you have symptoms of your blood sugar being too high or too low.  please keep a record of the readings and bring it to your next appointment here.  please call us sooner if your blood sugar goes below 70, or if it stays over 200.  Please come back for a follow-up appointment for 1 month.

## 2011-10-21 NOTE — Progress Notes (Signed)
Subjective:    Patient ID: Judith Shepherd, female    DOB: 1946/06/29, 66 y.o.   MRN: [id number]  HPI Pt returns for f/u of type 2 DM (dx'ed 1994, copmlicated by retinopathy and CAD).  She was recently in the hospital with chf.  levemir was increased to 20 units qd.  no cbg record, but states cbg's are in the 100's.   Past Medical History  Diagnosis Date  . Proliferative diabetic retinopathy   . CAD 10/2008 cath    [facility] with diffuse small branch CAD  . DIASTOLIC HEART FAILURE, CHRONIC     echo 10/2008  . Arthritis     R>L knee, declines TKR  . ANEMIA   . Diabetes mellitus, type 2     uncontrolled, med noncompliance  . DYSLIPIDEMIA   . HYPERTENSION   . CARPAL TUNNEL SYNDROME, BILATERAL     Past Surgical History  Procedure Date  . Abdominal hysterectomy     History   Social History  . Marital Status: Divorced    Spouse Name: N/A    Number of Children: N/A  . Years of Education: N/A   Occupational History  . Not on file.   Social History Main Topics  . Smoking status: Never Smoker   . 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 Alcohol Use: No  . Drug Use: No  . Sexually Active:    Other Topics Concern  . 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 amLODipine-atorvastatin (CADUET) 10-80 MG per tablet Take 1 tablet by mouth every other day.        Marland Kitchen aspirin 325 MG tablet Take 325 mg by mouth daily.      . carvedilol (COREG) 25 MG tablet Take 1 tablet (25 mg total) by mouth 2 (two) times daily with a meal.  60 tablet  5  . ferrous sulfate 325 (65 FE) MG tablet Take 1 tablet (325 mg total) by mouth 3 (three) times daily with meals.  30 tablet  11  . furosemide (LASIX) 40 MG tablet Take 1 tablet (40 mg total) by mouth 2 (two) times daily.   60 tablet  2  . lisinopril (PRINIVIL,ZESTRIL) 20 MG tablet TAKE ONE TABLET BY MOUTH EVERY DAY  30 tablet  3  . loratadine (CLARITIN) 10 MG tablet Take 1 tablet (10 mg total) by mouth daily.  30 tablet  1  . potassium chloride (KLOR-CON) 10 MEQ CR tablet Take 2 tablets (20 mEq total) by mouth daily.  60 tablet  11  . traMADol (ULTRAM) 50 MG tablet TAKE ONE TABLET BY MOUTH EVERY 6 HOURS AS NEEDED FOR PAIN  45 tablet  0  . DISCONTD: insulin detemir (LEVEMIR FLEXPEN) 100 UNIT/ML injection Inject 20 Units into the skin at bedtime.  3 mL  12    No Known Allergies  Family History  Problem Relation Age of Onset  . Heart disease Mother   . Diabetes Mother   . Diabetes Sister   . Cancer Sister   . Diabetes Sister     BP 152/84  Pulse 85  Temp(Src) 99.5 F (37.5 C) (Oral)  Ht 5\' 6"  (1.676 m)  Wt 203 lb 1.9 oz (92.135 kg)  BMI 32.78 kg/m2  SpO2 97%  Review of Systems She had 1 episode of cbg of 64, in am.      Objective:   Physical Exam VITAL SIGNS:  See vs page GENERAL: no distress PSYCH: Alert and oriented x 3.  Does not appear anxious nor depressed.     Assessment & Plan:  DM.  Based on the pattern of her cbg's, she needs some adjustment in her therapy.  therapy limited by pt's need for a simple regimen

## 2011-10-23 ENCOUNTER — Telehealth: Payer: Self-pay | Admitting: *Deleted

## 2011-10-23 NOTE — Telephone Encounter (Signed)
Spoke with patient today- she is going to wait until her appointment with Lorin Picket before she will schedule test.  ----- Message ----- From: Jacqlyn Krauss, RN Sent: 10/17/2011 3:26 PM To: Pricilla Holm Subject: RE: echo   Dr Shirlee Latch said she needed a repeat echo in 6-8 weeks. Thanks. ----- Message ----- From: Pricilla Holm Sent: 10/15/2011 2:49 PM To: Jacqlyn Krauss, RN Subject: echo   [name] ptatient stated she had this echo done while she was in the hospital. Their is a report for 4/12 in computer.

## 2011-11-04 ENCOUNTER — Telehealth: Payer: Self-pay | Admitting: *Deleted

## 2011-11-04 ENCOUNTER — Ambulatory Visit (INDEPENDENT_AMBULATORY_CARE_PROVIDER_SITE_OTHER): Payer: Medicare Other | Admitting: Physician Assistant

## 2011-11-04 ENCOUNTER — Encounter: Payer: Self-pay | Admitting: Physician Assistant

## 2011-11-04 VITALS — BP 162/76 | HR 88 | Ht 66.0 in | Wt 202.0 lb

## 2011-11-04 DIAGNOSIS — I313 Pericardial effusion (noninflammatory): Secondary | ICD-10-CM

## 2011-11-04 DIAGNOSIS — I509 Heart failure, unspecified: Secondary | ICD-10-CM

## 2011-11-04 DIAGNOSIS — I2510 Atherosclerotic heart disease of native coronary artery without angina pectoris: Secondary | ICD-10-CM

## 2011-11-04 DIAGNOSIS — I319 Disease of pericardium, unspecified: Secondary | ICD-10-CM

## 2011-11-04 DIAGNOSIS — I5031 Acute diastolic (congestive) heart failure: Secondary | ICD-10-CM

## 2011-11-04 DIAGNOSIS — R0602 Shortness of breath: Secondary | ICD-10-CM

## 2011-11-04 LAB — BASIC METABOLIC PANEL
BUN: 26 mg/dL — ABNORMAL HIGH (ref 6–23)
Calcium: 9 mg/dL (ref 8.4–10.5)
Creatinine, Ser: 1.3 mg/dL — ABNORMAL HIGH (ref 0.4–1.2)
GFR: 52.29 mL/min — ABNORMAL LOW (ref >60.00)
Glucose, Bld: 268 mg/dL — ABNORMAL HIGH (ref 70–99)
Potassium: 4.2 mEq/L (ref 3.5–5.1)

## 2011-11-04 LAB — BRAIN NATRIURETIC PEPTIDE: Pro B Natriuretic peptide (BNP): 487 pg/mL — ABNORMAL HIGH (ref 0.0–100.0)

## 2011-11-04 MED ORDER — POTASSIUM CHLORIDE CRYS ER 20 MEQ PO TBCR: EXTENDED_RELEASE_TABLET | ORAL | Status: DC

## 2011-11-04 MED ORDER — FUROSEMIDE 40 MG PO TABS: ORAL_TABLET | ORAL | Status: DC

## 2011-11-04 NOTE — Progress Notes (Addendum)
[address] [place], [place]  [address] Phone: [phone] Fax:  [phone]  Date:  11/04/2011   Name:  Judith Shepherd   DOB:  1946/06/29   MRN:  [id number]  PCP:  Rene Paci, MD, MD  Primary Cardiologist:  Dr. Marca Ancona  Primary Electrophysiologist:  None    History of Present Illness: Judith Shepherd is a 66 y.o. female who returns for post hospital follow up.  She has a history of CAD by cath (small branch vessel involvement), HTN, diabetes, diastolic CHF.   She was admitted 4/11-4/13 with acute on chronic diastolic heart failure complicated by pericardial effusion as well as acute on chronic kidney disease.  Cardiology was not consulted.  Chest x-ray demonstrated pulmonary vascular congestion as well as enlarged cardiac silhouette suggestive of pericardial effusion.  Echocardiogram is as noted below (mod effusion; speckled appearance - ? Amyloid)  She was diuresed.  Notes in the chart indicate Dr. Shirlee Latch has recommended follow up echocardiogram in 6-8 weeks  2D Echocardiogram 10/11/11:  Study Conclusions  - Left ventricle: The cavity size was normal. Wall thickness was increased in a pattern of severe LVH. Systolic function was normal. The estimated ejection fraction was in the range of 60% to 65%. Wall motion was normal; there were no regional wall motion abnormalities. Features are consistent with a pseudonormal left ventricular filling pattern, with concomitant abnormal relaxation and increased filling pressure (grade 2 diastolic dysfunction). Doppler parameters are consistent with high ventricular filling pressure. - Left atrium: The atrium was mildly dilated. - Pericardium, extracardiac: A moderate pericardial effusion was identified. There was mildright atrial chamber collapse.  Impressions: - Normal LV function with severe LVH; myocardium with speckled appearance; consider amyloid.  Overall, doing well.  Breathing is better.   She describes class II symptoms.  She denies orthopnea or PND.  She denies lower extremity edema.  Weights at home have been stable.  She occasionally gets short of breath with conversations.  She denies chest pain.  She denies syncope.  Wt Readings from Last 3 Encounters:  11/04/11 202 lb (91.627 kg)  10/21/11 203 lb 1.9 oz (92.135 kg)  10/12/11 204 lb (92.534 kg)     Lab Results  Component Value Date   CREATININE 1.67* 10/12/2011   BUN 29* 10/12/2011   NA 140 10/12/2011   K 3.7 10/12/2011   CL 103 10/12/2011   CO2 27 10/12/2011     Pro B Natriuretic peptide (BNP)  Date/Time Value Range Status  10/10/2011  8:57 PM 2354.0* 0-125 (pg/mL) Final     Past Medical History  Diagnosis Date  . Proliferative diabetic retinopathy   . CAD 10/2008 cath    Adenosine myoview (5/10): EF 38%, mild anterior ischemia. LHC/RHC (5/10): mean RA 3, mean PCWP 5, EF 65%. Diffuse CAD in small branch vessels, consistent with diabetes. No significant stenoses in the major vessels.   . Chronic diastolic heart failure     Probably diastolic. EF 65% on echo (myoview read as 38% but may be inaccurate). Echo (5/10) with mod-severe LVH, EF 65%, diastolic dysfunction with evidence for increased LV filling pressure, no significant AS, mild LAE.  Echo 4/13:  Severe LVH, EF 60-65%, grade 2 diast dysfxn, mild LAE, mod pericardial eff with mild R atrial chamber collapse, myocardium with speckled appearance; consider amyloid.  . Arthritis     R>L knee, declines TKR  . ANEMIA   . Diabetes mellitus, type 2     uncontrolled, med  noncompliance  . DYSLIPIDEMIA   . HYPERTENSION   . CARPAL TUNNEL SYNDROME, BILATERAL     Current Outpatient Prescriptions  Medication Sig Dispense Refill  . amLODipine-atorvastatin (CADUET) 10-80 MG per tablet Take 1 tablet by mouth every other day.        Marland Kitchen aspirin 325 MG tablet Take 325 mg by mouth daily.      . carvedilol (COREG) 25 MG tablet Take 1 tablet (25 mg total) by mouth 2 (two) times  daily with a meal.  60 tablet  5  . ferrous sulfate 325 (65 FE) MG tablet Take 1 tablet (325 mg total) by mouth 3 (three) times daily with meals.  30 tablet  11  . furosemide (LASIX) 40 MG tablet Take 1 tablet (40 mg total) by mouth 2 (two) times daily.  60 tablet  2  . insulin detemir (LEVEMIR) 100 UNIT/ML injection Inject 25 Units into the skin every morning.      Marland Kitchen lisinopril (PRINIVIL,ZESTRIL) 20 MG tablet TAKE ONE TABLET BY MOUTH EVERY DAY  30 tablet  3  . loratadine (CLARITIN) 10 MG tablet Take 1 tablet (10 mg total) by mouth daily.  30 tablet  1  . potassium chloride (KLOR-CON) 10 MEQ CR tablet Take 2 tablets (20 mEq total) by mouth daily.  60 tablet  11  . traMADol (ULTRAM) 50 MG tablet TAKE ONE TABLET BY MOUTH EVERY 6 HOURS AS NEEDED FOR PAIN  45 tablet  0    Allergies: No Known Allergies  History  Substance Use Topics  . Smoking status: Never Smoker   . 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 Alcohol Use: No     ROS:  Please see the history of present illness.   All other systems reviewed and negative.   PHYSICAL EXAM: VS:  BP 162/76  Pulse 88  Ht 5\' 6"  (1.676 m)  Wt 202 lb (91.627 kg)  BMI 32.60 kg/m2 Repeat BP by me 144/70  Well nourished, well developed, in no acute distress HEENT: normal Neck: minimally elevated JVD Cardiac:  normal S1, S2; RRR; 1/6 systolic murmur LSB Lungs:  Dry crackles in bases bilaterally, no wheezing, rhonchi or rales Abd: soft, nontender, no hepatomegaly Ext: no edema Skin: warm and dry Neuro:  CNs 2-12 intact, no focal abnormalities noted  EKG:  Sinus rhythm, heart rate 88, left axis deviation, T wave inversions in 1, aVL, V6, poor R wave progression, no change from prior tracings  ASSESSMENT AND PLAN:  1. Chronic Diastolic CHF  Overall, stable.  I believe that her volume is still somewhat increased.    I will have her take Lasix 60 mg b.i.d. For 3 days.   Increase potassium to 20 mEq b.i.d. For 3 days.  Then resume Lasix 40 mg b.i.d. And potassium 20 mEq daily.  Check a basic metabolic panel today and a BNP.  Repeat a basic metabolic panel in one week.  2. Pericardial Effusion  Repeat echocardiogram in the next 2-4 weeks.  F/u with Dr. Marca Ancona in 3-4 weeks  3. Hypertension  Increase Lasix for now  Monitor  4. Chronic Kidney Disease  Check bmet today and repeat in one week   Signed, Tereso Newcomer, PA-C  8:22 AM 11/04/2011

## 2011-11-04 NOTE — Telephone Encounter (Signed)
Advised if labs

## 2011-11-04 NOTE — Patient Instructions (Signed)
Your physician has recommended you make the following change in your medication: INCREASE LASIX TO 60 MG TWICE DAILY FOR 3 DAYS ONLY THEN RESUME TO LASIX 40 MG TWICE DAILY; TAKE POTASSIUM 20 MEQ TWICE DAILY FOR 3 DAYS ONLY THEN TAKE POTASSIUM 20 MEQ DAILY  Your physician recommends that you return for lab work in: BMET, BNP  REPEAT BMET IN 1 WEEK 401.1  Your physician has requested that you have an echocardiogram DX PERICARDIAL EFFUSION THIS IS TO BE SCHEDULED AROUND THE END OF MAY OR THE 1 ST WEEK OF June PER DR. [facility] AND SCOTT WEAVER, PAC. Painless test that uses sound waves to create images of your heart. It provides your doctor with information about the size and shape of your heart and how well your heart's chambers and valves are working. This procedure takes approximately one hour. There are no restrictions for this procedure.   Your physician recommends that you schedule a follow-up appointment in: 3-4 WEEKS WITH DR. Shirlee Latch AFTER THE ECHO HAS BEEN DONE

## 2011-11-04 NOTE — Telephone Encounter (Signed)
Message copied by Burnell Blanks on Mon Nov 04, 2011  5:10 PM ------      Message from: [place], [place] T      Created: Mon Nov 04, 2011  4:59 PM       Creatinine stable      Continue with med changes and lab follow up as discussed at OV today       Tereso Newcomer, PA-C  4:59 PM 11/04/2011

## 2011-11-11 ENCOUNTER — Other Ambulatory Visit (INDEPENDENT_AMBULATORY_CARE_PROVIDER_SITE_OTHER): Payer: Medicare Other

## 2011-11-11 DIAGNOSIS — I509 Heart failure, unspecified: Secondary | ICD-10-CM

## 2011-11-11 DIAGNOSIS — I5031 Acute diastolic (congestive) heart failure: Secondary | ICD-10-CM

## 2011-11-11 LAB — BASIC METABOLIC PANEL
BUN: 22 mg/dL (ref 6–23)
Calcium: 8.9 mg/dL (ref 8.4–10.5)
Chloride: 99 mEq/L (ref 96–112)
Creatinine, Ser: 1.3 mg/dL — ABNORMAL HIGH (ref 0.4–1.2)

## 2011-11-14 NOTE — Progress Notes (Signed)
No answer

## 2011-12-02 ENCOUNTER — Ambulatory Visit (HOSPITAL_COMMUNITY): Payer: Medicare Other | Attending: Cardiology | Admitting: Radiology

## 2011-12-02 DIAGNOSIS — I509 Heart failure, unspecified: Secondary | ICD-10-CM | POA: Insufficient documentation

## 2011-12-02 DIAGNOSIS — I517 Cardiomegaly: Secondary | ICD-10-CM | POA: Insufficient documentation

## 2011-12-02 DIAGNOSIS — E669 Obesity, unspecified: Secondary | ICD-10-CM | POA: Insufficient documentation

## 2011-12-02 DIAGNOSIS — R011 Cardiac murmur, unspecified: Secondary | ICD-10-CM | POA: Insufficient documentation

## 2011-12-02 DIAGNOSIS — R0609 Other forms of dyspnea: Secondary | ICD-10-CM | POA: Insufficient documentation

## 2011-12-02 DIAGNOSIS — I319 Disease of pericardium, unspecified: Secondary | ICD-10-CM | POA: Insufficient documentation

## 2011-12-02 DIAGNOSIS — I313 Pericardial effusion (noninflammatory): Secondary | ICD-10-CM

## 2011-12-02 DIAGNOSIS — I10 Essential (primary) hypertension: Secondary | ICD-10-CM | POA: Insufficient documentation

## 2011-12-02 DIAGNOSIS — R0989 Other specified symptoms and signs involving the circulatory and respiratory systems: Secondary | ICD-10-CM | POA: Insufficient documentation

## 2011-12-02 DIAGNOSIS — I2510 Atherosclerotic heart disease of native coronary artery without angina pectoris: Secondary | ICD-10-CM | POA: Insufficient documentation

## 2011-12-02 DIAGNOSIS — E119 Type 2 diabetes mellitus without complications: Secondary | ICD-10-CM | POA: Insufficient documentation

## 2011-12-02 NOTE — Progress Notes (Signed)
Echocardiogram performed.  

## 2011-12-04 ENCOUNTER — Telehealth: Payer: Self-pay | Admitting: Cardiology

## 2011-12-04 ENCOUNTER — Other Ambulatory Visit: Payer: Self-pay | Admitting: *Deleted

## 2011-12-04 DIAGNOSIS — I319 Disease of pericardium, unspecified: Secondary | ICD-10-CM

## 2011-12-04 NOTE — Telephone Encounter (Signed)
Patient returning nurse AL call, she can be reached at [phone].

## 2011-12-04 NOTE — Telephone Encounter (Signed)
Spoke with pt about recent echo results. 

## 2011-12-06 ENCOUNTER — Other Ambulatory Visit: Payer: Medicare Other

## 2011-12-06 DIAGNOSIS — I319 Disease of pericardium, unspecified: Secondary | ICD-10-CM

## 2011-12-11 ENCOUNTER — Ambulatory Visit: Payer: Medicare Other | Admitting: Cardiology

## 2011-12-13 ENCOUNTER — Other Ambulatory Visit: Payer: Self-pay | Admitting: *Deleted

## 2011-12-13 MED ORDER — INSULIN DETEMIR 100 UNIT/ML ~~LOC~~ SOLN: 25.0000 [IU] | SUBCUTANEOUS | Status: DC

## 2011-12-13 NOTE — Telephone Encounter (Signed)
R'cd fax from [facility] for refill of Levemir Flexpen

## 2011-12-30 ENCOUNTER — Encounter: Payer: Self-pay | Admitting: Cardiology

## 2011-12-30 ENCOUNTER — Ambulatory Visit (INDEPENDENT_AMBULATORY_CARE_PROVIDER_SITE_OTHER): Payer: Medicare Other | Admitting: Cardiology

## 2011-12-30 VITALS — BP 138/98 | HR 78 | Ht 66.0 in | Wt 217.0 lb

## 2011-12-30 DIAGNOSIS — I319 Disease of pericardium, unspecified: Secondary | ICD-10-CM

## 2011-12-30 DIAGNOSIS — I3139 Other pericardial effusion (noninflammatory): Secondary | ICD-10-CM | POA: Insufficient documentation

## 2011-12-30 DIAGNOSIS — I5032 Chronic diastolic (congestive) heart failure: Secondary | ICD-10-CM

## 2011-12-30 DIAGNOSIS — I10 Essential (primary) hypertension: Secondary | ICD-10-CM

## 2011-12-30 DIAGNOSIS — N189 Chronic kidney disease, unspecified: Secondary | ICD-10-CM

## 2011-12-30 DIAGNOSIS — I313 Pericardial effusion (noninflammatory): Secondary | ICD-10-CM

## 2011-12-30 DIAGNOSIS — I2510 Atherosclerotic heart disease of native coronary artery without angina pectoris: Secondary | ICD-10-CM

## 2011-12-30 NOTE — Assessment & Plan Note (Addendum)
Moderate in size, no tamponade on study done in 6/13.  This is stable from 4/13. ? Cause => my concern as above is that it could be related to cardiac amyloidosis.

## 2011-12-30 NOTE — Assessment & Plan Note (Signed)
No chest pain.  Continue ASA, Coreg, ACEI, statin.  She has not been taking Caduet regularly and LDL was 124 in 3/13.  Goal LDL < 70.  I asked her to go back to taking Caduet regularly, will see what lipids look like in about 2 months.

## 2011-12-30 NOTE — Assessment & Plan Note (Signed)
Diastolic BP still elevated.  As above, needs to restart Caduet regularly.

## 2011-12-30 NOTE — Assessment & Plan Note (Addendum)
GFR about 42 when creatinine last checked.  Will need to keep an eye on this.  May need to eventually do cardiac MRI with contrast.  Will need to recheck BMET before doing this.   I spent > 40 minutes talking to patient and reviewing records.

## 2011-12-30 NOTE — Assessment & Plan Note (Signed)
Chronic diastolic CHF with volume overload on exam, NYHA class III symptoms.  Given her echo appearance with pericardial effusion (stable from 4/13 to 6/13) without tamponade and moderate-severe LVH and somewhat speckled myocardium as well as mild RVH, I am concerned that she could have cardiac amyloidosis.  The alternative explanation for the LVH would be poorly-controlled HTN, but this would not explain the effusion.  Serum immunofixation did not show a monoclonal gammopathy and ECG was not low voltage.  This makes AL amyloidosis less likely, but ATTR-related amyloidosis (transthyretin mutation) is still a possibility.   - She will restart Lasix at 40 mg bid (has been off for several days).  I will reassess her volume status when she returns in a month.  - I think that we need further workup for amyloidosis.  The next step would be an abdominal fat pad biopsy.  We talked at length about this today. I am not sure she completely understood me though I tried to explain the clinical situation to her as best I could.  She is going to think about it and we will call her later this week to see if she wants to go forward with the biopsy (through IR).  Cardiac MRI can also help make the diagnosis.  I am going to hold off on this for now given her CKD.  If she refuses the biopsy, will revisit this.

## 2011-12-30 NOTE — Patient Instructions (Addendum)
Think about having an abdominal fat pad biopsy for amyloidoisis. I will call you in about a week to see what you have decided. Judith Shepherd  Your physician recommends that you schedule a follow-up appointment in: 1 month with Dr Shirlee Latch.

## 2011-12-30 NOTE — Progress Notes (Signed)
Patient ID: Judith Shepherd, female   DOB: 07/16/1945, 66 y.o.   MRN: [id number] PCP: Dr. Felicity Coyer  66 yo with history of CAD by cath (small branch vessel involvement), HTN, diabetes, and diastolic CHF presents for followup. Since I last saw her, she was admitted in 4/13 with acute on chronic diastolic CHF and was diuresed with IV Lasix.  Echo at that time showed significant LVH with a moderate pericardial effusion.  There was concern for cardiac amyloidosis.  She has seen Tereso Newcomer in the office a couple of times.  He has been adjusting her Lasix.  She was at the beach this past weekend for 3 days.  She has not taken Lasix since last Thursday.  She has noted increased lower extremity edema . Weight is actually up about 15 lbs compared to prior appointment with the PA. She reports mild exertional dyspnea after walking about a block or climbing a flight of steps.  No orthopnea, PND, or chest pain.  No lightheadedness/syncope.  Repeat echo earlier this month showed stable moderate pericardial effusion without tamponade and moderate-severe LVH.  Of note, she has not been taking her Caduet regularly.   Labs (12/1): LDL 88, HDL 50, TG 78, creatinine 1.1  Labs (3/13): LDL 124, HDL 52 Labs (5/13): K 3.8, creatinine 1.3, BNP 487 Labs (6/13): Immunofixation showed polyclonal gammopathy  Allergies (verified):  No Known Drug Allergies   Past Medical History:  1. DM2  2. HTN  3. Obesity  4. Hyperlipidemia  5. Diabetic peripheral neuropathy  6. CAD: Adenosine myoview (5/10): EF 38%, mild anterior ischemia. LHC/RHC (5/10): mean RA 3, mean PCWP 5, EF 65%. Diffuse CAD in small branch vessels, consistent with diabetes. No significant stenoses in the major vessels.  7. CHF: Diastolic. Echo (5/10) with mod-severe LVH, EF 65%, diastolic dysfunction with evidence for increased LV filling pressure, no significant AS, mild LAE.  Echo (6/13): Moderate circumferential pericardial effusion without tamponade (IVC has  normal respirophasic variation), moderate to severe LVH with EF 65%, mild RVH, diastolic dysfunction.  Appearance of LV on echo concerning for amyloidosis.  Serum immunofixation with polyclonal gammopathy.  8. CKD  Family History:  Mother with MI at 41   Social History:  Lives alone in [place]. Has children. Nonsmoker, no ETOH. Unemployed.   Review of Systems  All systems reviewed and negative except as per HPI.   Current Outpatient Prescriptions  Medication Sig Dispense Refill  . amLODipine-atorvastatin (CADUET) 10-80 MG per tablet Take 1 tablet by mouth every other day.        Marland Kitchen aspirin 325 MG tablet Take 325 mg by mouth daily.      . carvedilol (COREG) 25 MG tablet Take 1 tablet (25 mg total) by mouth 2 (two) times daily with a meal.  60 tablet  5  . ferrous sulfate 325 (65 FE) MG tablet Take 1 tablet (325 mg total) by mouth 3 (three) times daily with meals.  30 tablet  11  . furosemide (LASIX) 40 MG tablet TAKE 60 MG TWICE DAILY FOR 3 DAYS ONLY THEN RESUME LASIX 40 MG TWICE DAILY  90 tablet  6  . insulin detemir (LEVEMIR) 100 UNIT/ML injection Inject 25 Units into the skin every morning.  15 mL  3  . lisinopril (PRINIVIL,ZESTRIL) 20 MG tablet TAKE ONE TABLET BY MOUTH EVERY DAY  30 tablet  3  . loratadine (CLARITIN) 10 MG tablet Take 1 tablet (10 mg total) by mouth daily.  30 tablet  1  .  potassium chloride SA (K-DUR,KLOR-CON) 20 MEQ tablet TAKE 20 MEQ TWICE DAILY FOR 3 DAYS ONLY THEN STAY ON POTASSIUM 20 MEQ DAILY  75 tablet  6  . traMADol (ULTRAM) 50 MG tablet TAKE ONE TABLET BY MOUTH EVERY 6 HOURS AS NEEDED FOR PAIN  45 tablet  0    BP 138/98  Pulse 78  Ht 5\' 6"  (1.676 m)  Wt 98.431 kg (217 lb)  BMI 35.02 kg/m2 General: NAD Neck: JVP 10-12 cm, no thyromegaly or thyroid nodule.  Lungs: Clear to auscultation bilaterally with normal respiratory effort. CV: Nondisplaced PMI.  Heart regular S1/S2, no S3/S4, no murmur.  1+ edema to 1/2 to knees bilaterally.  No carotid bruit.   Normal pedal pulses.  Abdomen: Soft, nontender, no hepatosplenomegaly, no distention.  Neurologic: Alert and oriented x 3.  Psych: Normal affect. Extremities: No clubbing or cyanosis.

## 2011-12-31 ENCOUNTER — Other Ambulatory Visit: Payer: Self-pay

## 2011-12-31 MED ORDER — INSULIN PEN NEEDLE 31G X 5 MM MISC: 1.0000 | Freq: Every day | Status: DC

## 2012-01-07 ENCOUNTER — Telehealth: Payer: Self-pay | Admitting: *Deleted

## 2012-01-07 NOTE — Telephone Encounter (Signed)
-   I think that we need further workup for amyloidosis. The next step would be an abdominal fat pad biopsy. We talked at length about this today. I am not sure she completely understood me though I tried to explain the clinical situation to her as best I could. She is going to think about it and we will call her later this week to see if she wants to go forward with the biopsy (through IR). Cardiac MRI can also help make the diagnosis. I am going to hold off on this for now given her CKD. If she refuses the biopsy, will revisit this.   01/07/12 I spoke with pt. Pt states she has thought about having abdominal fat pad biopsy. Pt states right now she does not want to proceed with this. She will let me know if she changes her mind. I will forward to Dr Shirlee Latch.

## 2012-01-07 NOTE — Telephone Encounter (Signed)
Judith Shepherd, see if she would be willing to do a cardiac MRI to try to make the diagnosis of amyloidosis.

## 2012-01-09 NOTE — Telephone Encounter (Signed)
Spoke with pt. Pt does not want to have cardiac MRI at this time. She knows to call back if she changes her mind.

## 2012-01-09 NOTE — Telephone Encounter (Signed)
Pt unavailable

## 2012-01-13 ENCOUNTER — Ambulatory Visit (INDEPENDENT_AMBULATORY_CARE_PROVIDER_SITE_OTHER): Payer: Medicare Other | Admitting: Internal Medicine

## 2012-01-13 ENCOUNTER — Other Ambulatory Visit (INDEPENDENT_AMBULATORY_CARE_PROVIDER_SITE_OTHER): Payer: Medicare Other

## 2012-01-13 ENCOUNTER — Encounter: Payer: Self-pay | Admitting: Internal Medicine

## 2012-01-13 VITALS — BP 152/80 | HR 69 | Temp 98.4°F | Ht 66.0 in | Wt 212.6 lb

## 2012-01-13 DIAGNOSIS — G609 Hereditary and idiopathic neuropathy, unspecified: Secondary | ICD-10-CM

## 2012-01-13 DIAGNOSIS — I5032 Chronic diastolic (congestive) heart failure: Secondary | ICD-10-CM

## 2012-01-13 DIAGNOSIS — H579 Unspecified disorder of eye and adnexa: Secondary | ICD-10-CM

## 2012-01-13 DIAGNOSIS — E1165 Type 2 diabetes mellitus with hyperglycemia: Secondary | ICD-10-CM

## 2012-01-13 DIAGNOSIS — E1139 Type 2 diabetes mellitus with other diabetic ophthalmic complication: Secondary | ICD-10-CM

## 2012-01-13 DIAGNOSIS — I10 Essential (primary) hypertension: Secondary | ICD-10-CM

## 2012-01-13 DIAGNOSIS — G629 Polyneuropathy, unspecified: Secondary | ICD-10-CM

## 2012-01-13 LAB — HEMOGLOBIN A1C: Hgb A1c MFr Bld: 12.2 % — ABNORMAL HIGH (ref 4.6–6.5)

## 2012-01-13 MED ORDER — GABAPENTIN 300 MG PO CAPS: 300.0000 mg | ORAL_CAPSULE | Freq: Every day | ORAL | Status: DC

## 2012-01-13 NOTE — Assessment & Plan Note (Signed)
Complicated by noncompliance - working intermittently with endocrine ([facility]) Working with [facility] (optho) on retinopathy tx Changed to Levemir 09/2011, no longer on oral meds again, pleaded for med compliance and reviewed consquences of med noncompliance and uncontrolled DM -  pt with limited acceptance of information reminded exercise and diet unlikely to provided adequate medical control of DM but encouraged to comply with plans for same in addition to meds Prescribed ACEI, ASA 81, statin - but variable compliance  Lab Results  Component Value Date   HGBA1C 10.9* 10/10/2011

## 2012-01-13 NOTE — Assessment & Plan Note (Signed)
Recurrent exacerbation - also complicated by variable med compliance Exac/hosp 09/2011 reviewed ?amyloid cardiomyopathy - but pt continue to refuse additional eval for same (no bx or MRI) Working on hypertension control - but still variable compliance - see next Follow up with cards as planned

## 2012-01-13 NOTE — Assessment & Plan Note (Signed)
BP Readings from Last 3 Encounters:  01/13/12 152/80  12/30/11 138/98  11/04/11 162/76   The current medical regimen's success is limited by non compliance;   continue present plan and medications and requested participation in pt's prescribed regimen

## 2012-01-13 NOTE — Progress Notes (Signed)
Subjective:    Patient ID: Judith Shepherd, female    DOB: Feb 22, 1946, 66 y.o.   MRN: [id number]  HPI  Here for follow up - reviewed chronic medical issues:   DM2, uncontrolled - complicated by neuropathy and retinopathy -  actos stopped due to CHF 09/2010; added januvia 10/2010, but variable compliance Then changes to Levemir -follows intermittently with endo ([facility]) for same -  reports sugars checked at home with urine dip 2x/d -  reports intermittent compliance with prescribed medical treatment due to $$ and concern for side effects.   hypertension - reports variable compliance with ongoing medical treatment and no changes in medication dose or frequency. denies adverse side effects related to current therapy.  no headache or chest pain -  dyslipidemia - reports variable compliance with ongoing medical treatment and no changes in medication dose or frequency. denies adverse side effects related to current therapy.   dCHF - cardiomyopathy, ?amyloidosis - diastolic CHF exac 09/2011 requiring hospitalization - working with cards on same and reports compliance with diuretics and med mgmt - concerned about "circulation" problems with her heart (see CC) -denies dyspnea on exertion or edema change  Anemia, chronic dz - Denies BRBPR or epigastric pain - colo 03/2010 unremarkable -   Past Medical History  Diagnosis Date  . Proliferative diabetic retinopathy   . CAD 10/2008 cath    Adenosine myoview (5/10): EF 38%, mild anterior ischemia. LHC/RHC (5/10): mean RA 3, mean PCWP 5, EF 65%. Diffuse CAD in small branch vessels, consistent with diabetes. No significant stenoses in the major vessels.   . Chronic diastolic heart failure     Probably diastolic. EF 65% on echo (myoview read as 38% but may be inaccurate). Echo (5/10) with mod-severe LVH, EF 65%, diastolic dysfunction with evidence for increased LV filling pressure, no significant AS, mild LAE.  Echo 4/13:  Severe LVH, EF 60-65%, grade 2  diast dysfxn, mild LAE, mod pericardial eff with mild R atrial chamber collapse, myocardium with speckled appearance; consider amyloid.  . Arthritis     R>L knee, declines TKR  . ANEMIA   . Diabetes mellitus, type 2     uncontrolled, med noncompliance  . DYSLIPIDEMIA   . HYPERTENSION   . CARPAL TUNNEL SYNDROME, BILATERAL     Review of Systems  Constitutional: Positive for fatigue. Negative for fever.  Respiratory: Negative for cough and shortness of breath.   Cardiovascular: Negative for chest pain, palpitations and leg swelling.  Genitourinary: Negative for dysuria and urgency.      Objective:   Physical Exam  BP 152/80  Pulse 69  Temp 98.4 F (36.9 C) (Oral)  Ht 5\' 6"  (1.676 m)  Wt 212 lb 9.6 oz (96.435 kg)  BMI 34.31 kg/m2  SpO2 97% Wt Readings from Last 3 Encounters:  01/13/12 212 lb 9.6 oz (96.435 kg)  12/30/11 217 lb (98.431 kg)  11/04/11 202 lb (91.627 kg)   Constitutional: She is overweight; appears well-developed and well-nourished. No distress.  Cardiovascular: Normal rate, regular rhythm and normal heart sounds.  No murmur heard. trace BLE edema Pulmonary/Chest: Effort normal and breath sounds normal. No respiratory distress. She has no wheezes.      Lab Results  Component Value Date   WBC 7.2 10/12/2011   HGB 8.2* 10/12/2011   HCT 25.7* 10/12/2011   PLT 276 10/12/2011   CHOL 200 09/16/2011   TRIG 122.0 09/16/2011   HDL 51.70 09/16/2011   LDLDIRECT 130.7 02/23/2010   ALT 9 10/10/2011  AST 13 10/10/2011   NA 140 11/11/2011   K 3.8 11/11/2011   CL 99 11/11/2011   CREATININE 1.3* 11/11/2011   BUN 22 11/11/2011   CO2 28 11/11/2011   TSH 2.036 10/10/2011   INR 0.9 ratio 11/23/2008   HGBA1C 10.9* 10/10/2011   MICROALBUR 4.9* 02/23/2010     Assessment & Plan:  See problem list. Medications and labs reviewed today.

## 2012-01-13 NOTE — Patient Instructions (Addendum)
It was good to see you today. Test(s) ordered today. Your results will be called to you after review (48-72hours after test completion). If any changes need to be made, you will be notified at that time. Medications reviewed, add gabapentin at night for leg/foot numbness symptoms -  Your prescription(s) have been submitted to your pharmacy. Please take as directed and contact our office if you believe you are having problem(s) with the medication(s). No other medication changes at this time. Please take all of them as prescribed for best results Please schedule followup in 4 months for recheck, call sooner if problems.

## 2012-01-13 NOTE — Assessment & Plan Note (Signed)
B feet with numbness - improved with activity so doubt PAD Suspect related to diabetes mellitus -- or ?amyloid  Offered but pt declines NCS Start gabapentin 300mg  qhs for same

## 2012-02-24 ENCOUNTER — Ambulatory Visit: Payer: Medicare Other | Admitting: Cardiology

## 2012-03-16 ENCOUNTER — Other Ambulatory Visit: Payer: Self-pay | Admitting: Internal Medicine

## 2012-03-16 ENCOUNTER — Other Ambulatory Visit: Payer: Self-pay | Admitting: General Practice

## 2012-03-16 MED ORDER — CARVEDILOL 25 MG PO TABS: 25.0000 mg | ORAL_TABLET | Freq: Two times a day (BID) | ORAL | Status: DC

## 2012-04-24 ENCOUNTER — Ambulatory Visit (INDEPENDENT_AMBULATORY_CARE_PROVIDER_SITE_OTHER): Payer: Medicare Other | Admitting: Internal Medicine

## 2012-04-24 ENCOUNTER — Other Ambulatory Visit (INDEPENDENT_AMBULATORY_CARE_PROVIDER_SITE_OTHER): Payer: Medicare Other

## 2012-04-24 ENCOUNTER — Encounter: Payer: Self-pay | Admitting: Internal Medicine

## 2012-04-24 VITALS — BP 140/82 | HR 71 | Temp 98.8°F | Ht 66.0 in | Wt 213.0 lb

## 2012-04-24 DIAGNOSIS — I10 Essential (primary) hypertension: Secondary | ICD-10-CM

## 2012-04-24 DIAGNOSIS — I509 Heart failure, unspecified: Secondary | ICD-10-CM

## 2012-04-24 DIAGNOSIS — E1165 Type 2 diabetes mellitus with hyperglycemia: Secondary | ICD-10-CM

## 2012-04-24 DIAGNOSIS — E1139 Type 2 diabetes mellitus with other diabetic ophthalmic complication: Secondary | ICD-10-CM

## 2012-04-24 DIAGNOSIS — I5031 Acute diastolic (congestive) heart failure: Secondary | ICD-10-CM

## 2012-04-24 DIAGNOSIS — R0609 Other forms of dyspnea: Secondary | ICD-10-CM

## 2012-04-24 LAB — CBC WITH DIFFERENTIAL/PLATELET
Eosinophils Relative: 1.9 % (ref 0.0–5.0)
Lymphocytes Relative: 13.3 % (ref 12.0–46.0)
Monocytes Relative: 5.2 % (ref 3.0–12.0)
Neutrophils Relative %: 79.2 % — ABNORMAL HIGH (ref 43.0–77.0)
Platelets: 246 10*3/uL (ref 150.0–400.0)
WBC: 9.2 10*3/uL (ref 4.5–10.5)

## 2012-04-24 LAB — HEMOGLOBIN A1C: Hgb A1c MFr Bld: 12.3 % — ABNORMAL HIGH (ref 4.6–6.5)

## 2012-04-24 LAB — BASIC METABOLIC PANEL
Calcium: 9 mg/dL (ref 8.4–10.5)
GFR: 41.76 mL/min — ABNORMAL LOW (ref >60.00)
Potassium: 4.4 mEq/L (ref 3.5–5.1)
Sodium: 140 mEq/L (ref 135–145)

## 2012-04-24 MED ORDER — FUROSEMIDE 40 MG PO TABS: 40.0000 mg | ORAL_TABLET | Freq: Three times a day (TID) | ORAL | Status: DC

## 2012-04-24 MED ORDER — INSULIN DETEMIR 100 UNIT/ML ~~LOC~~ SOLN: 25.0000 [IU] | SUBCUTANEOUS | Status: DC

## 2012-04-24 NOTE — Assessment & Plan Note (Signed)
Complicated by noncompliance - working intermittently with endocrine ([facility]) Working with [facility] (optho) on retinopathy tx Changed to Levemir 09/2011, no longer on oral meds again, pleaded for med compliance and reviewed consquences of med noncompliance and uncontrolled DM -  pt with limited acceptance of information reminded exercise and diet unlikely to provided adequate medical control of DM but encouraged to comply with plans for same in addition to meds Also prescribed ACEI, ASA 81, statin - but variable compliance  Lab Results  Component Value Date   HGBA1C 12.2* 01/13/2012

## 2012-04-24 NOTE — Patient Instructions (Signed)
It was good to see you today. Test(s) ordered today. Your results will be called to you after review (48-72hours after test completion). If any changes need to be made, you will be notified at that time. Medications reviewed - increase lasix to 3x/day x 1 week, then resume 40mg  2x/day Your prescription(s) have been submitted to your pharmacy. Please take as directed and contact our office if you believe you are having problem(s) with the medication(s). No other medication changes at this time. Refill on medication(s) as discussed today. Please take all of them as prescribed for best results Please schedule followup in 4 months for recheck, call sooner if problems.

## 2012-04-24 NOTE — Assessment & Plan Note (Signed)
BP Readings from Last 3 Encounters:  04/24/12 140/82  01/13/12 152/80  12/30/11 138/98   The current medical regimen's success is limited by non compliance;  Reviewed importance of compliance - pt agrees to consider continue present plan and medications and requested participation in pt's prescribed regimen

## 2012-04-24 NOTE — Progress Notes (Signed)
Subjective:    Patient ID: Judith Shepherd, female    DOB: 01/03/1946, 66 y.o.   MRN: [id number]  Shortness of Breath Pertinent negatives include no chest pain, fever or leg swelling.   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   hypertension - reports variable compliance with ongoing medical treatment and no changes in medication dose or frequency. denies adverse side effects related to current therapy.  no headache or chest pain -  dyslipidemia - reports variable compliance with ongoing medical treatment and no changes in medication dose or frequency. denies adverse side effects related to current therapy.   dCHF - cardiomyopathy, ?amyloidosis - diastolic CHF exac 09/2011 requiring hospitalization - working with cards on same and reports compliance with diuretics and med mgmt - see above - increasing shortness of breath and dyspnea on exertion but no edema   Anemia, chronic dz - Denies BRBPR or epigastric pain - colo 03/2010 unremarkable -   Past Medical History  Diagnosis Date  . 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 CAD 10/2008 cath    Adenosine myoview (5/10): EF 38%, mild anterior ischemia. LHC/RHC (5/10): mean RA 3, mean PCWP 5, EF 65%. Diffuse CAD in small branch vessels, consistent with diabetes. No significant stenoses in the major vessels.   . Chronic diastolic heart failure     Probably diastolic. EF 65% on echo (myoview read as 38% but may be inaccurate). Echo (5/10) with mod-severe LVH, EF 65%, diastolic dysfunction with evidence for increased LV filling pressure, no significant AS, mild  LAE.  Echo 4/13:  Severe LVH, EF 60-65%, grade 2 diast dysfxn, mild LAE, mod pericardial eff with mild R atrial chamber collapse, myocardium with speckled appearance; consider amyloid.  . Arthritis     R>L knee, declines TKR  . ANEMIA   . Diabetes mellitus, type 2     uncontrolled, med noncompliance  . DYSLIPIDEMIA   . HYPERTENSION   . CARPAL TUNNEL SYNDROME, BILATERAL     Review of Systems  Constitutional: Positive for fatigue. Negative for fever.  Respiratory: Positive for shortness of breath. Negative for cough.   Cardiovascular: Negative for chest pain, palpitations and leg swelling.  Genitourinary: Negative for dysuria and urgency.      Objective:   Physical Exam  BP 140/82  Pulse 71  Temp 98.8 F (37.1 C) (Oral)  Ht 5\' 6"  (1.676 m)  Wt 213 lb (96.616 kg)  BMI 34.38 kg/m2  SpO2 93% Wt Readings from Last 3 Encounters:  04/24/12 213 lb (96.616 kg)  01/13/12 212 lb 9.6 oz (96.435 kg)  12/30/11 217 lb (98.431 kg)   Constitutional: She is overweight; appears well-developed and well-nourished. No distress.  Cardiovascular: Normal rate, regular rhythm and normal heart sounds.  No murmur heard. 1+ to trace BLE edema to midshin Pulmonary/Chest: Effort normal and breath sounds diminished at bases. No respiratory distress. She has faint end expiratory wheezes and fine crackles at base.    Lab Results  Component Value Date   WBC 7.2 10/12/2011   HGB 8.2* 10/12/2011   HCT 25.7* 10/12/2011   PLT 276 10/12/2011   CHOL 200 09/16/2011   TRIG  122.0 09/16/2011   HDL 51.70 09/16/2011   LDLDIRECT 130.7 02/23/2010   ALT 9 10/10/2011   AST 13 10/10/2011   NA 140 11/11/2011   K 3.8 11/11/2011   CL 99 11/11/2011   CREATININE 1.3* 11/11/2011   BUN 22 11/11/2011   CO2 28 11/11/2011   TSH 2.036 10/10/2011   INR 0.9 ratio 11/23/2008   HGBA1C 12.2* 01/13/2012   MICROALBUR 4.9* 02/23/2010    Assessment & Plan:  See problem list. Medications and labs reviewed today.  Dyspnea, PND - suspect  multifactorial - CHF with mild volume overload, anemia, weight restriction on TLV, and deconditioning -  Plan: check labs now - increase lasix x 1 week (40 TID), then resume BID Reminded of importance of medication compliance

## 2012-04-24 NOTE — Assessment & Plan Note (Signed)
Recurrent exacerbation - also complicated by variable med compliance recommended increase diuresis x 1 week (see above) hosp 09/2011 reviewed ?amyloid cardiomyopathy - but pt continues to refuse additional eval for same (no bx or MRI) Working on hypertension control - but still variable compliance - see next Follow up with cards as planned

## 2012-06-10 ENCOUNTER — Other Ambulatory Visit: Payer: Self-pay | Admitting: Endocrinology

## 2012-06-10 NOTE — Telephone Encounter (Signed)
PATIENT / PHARMACY REQUEST REFILL ON TRAMADOL. PATIENT LAST OV WITH YOU 09/2011. PLEASE ADVISE.

## 2012-06-11 NOTE — Telephone Encounter (Signed)
Patient medication Tramadol called to [facility] pharmacy.

## 2012-08-14 ENCOUNTER — Ambulatory Visit: Payer: Medicare Other | Admitting: Internal Medicine

## 2012-08-17 ENCOUNTER — Telehealth: Payer: Self-pay | Admitting: Cardiology

## 2012-08-17 NOTE — Telephone Encounter (Signed)
Pt states her breathing seems to be worse over the last few months. Pt  had already scheduled an appt with Dawayne Patricia on Friday. I advised pt that assessment could be made at that time to determine if oxygen would be indicated for her.

## 2012-08-17 NOTE — Telephone Encounter (Signed)
Spoke with pt

## 2012-08-17 NOTE — Telephone Encounter (Signed)
New problem    1. Can a portable oxygen tank be order . Due to sob from short distance walking. H/O CHF.    2. Also discuss home health care.

## 2012-08-19 ENCOUNTER — Ambulatory Visit: Payer: Medicare Other | Admitting: Physician Assistant

## 2012-08-21 ENCOUNTER — Ambulatory Visit (INDEPENDENT_AMBULATORY_CARE_PROVIDER_SITE_OTHER): Payer: Medicare Other | Admitting: Nurse Practitioner

## 2012-08-21 ENCOUNTER — Other Ambulatory Visit: Payer: Self-pay

## 2012-08-21 ENCOUNTER — Encounter: Payer: Self-pay | Admitting: Nurse Practitioner

## 2012-08-21 VITALS — BP 142/94 | HR 84 | Ht 66.0 in | Wt 219.4 lb

## 2012-08-21 DIAGNOSIS — R0600 Dyspnea, unspecified: Secondary | ICD-10-CM

## 2012-08-21 DIAGNOSIS — R0602 Shortness of breath: Secondary | ICD-10-CM

## 2012-08-21 DIAGNOSIS — E119 Type 2 diabetes mellitus without complications: Secondary | ICD-10-CM

## 2012-08-21 DIAGNOSIS — I10 Essential (primary) hypertension: Secondary | ICD-10-CM

## 2012-08-21 LAB — CBC WITH DIFFERENTIAL/PLATELET
Basophils Absolute: 0 10*3/uL (ref 0.0–0.1)
Basophils Relative: 0.4 % (ref 0.0–3.0)
Eosinophils Absolute: 0.1 10*3/uL (ref 0.0–0.7)
Eosinophils Relative: 1.4 % (ref 0.0–5.0)
HCT: 29.1 % — ABNORMAL LOW (ref 36.0–46.0)
Hemoglobin: 9.4 g/dL — ABNORMAL LOW (ref 12.0–15.0)
Lymphocytes Relative: 12.1 % (ref 12.0–46.0)
Lymphs Abs: 0.9 10*3/uL (ref 0.7–4.0)
MCHC: 32.2 g/dL (ref 30.0–36.0)
MCV: 78.5 fl (ref 78.0–100.0)
Monocytes Absolute: 0.4 10*3/uL (ref 0.1–1.0)
Monocytes Relative: 4.8 % (ref 3.0–12.0)
Neutro Abs: 6 10*3/uL (ref 1.4–7.7)
Neutrophils Relative %: 81.3 % — ABNORMAL HIGH (ref 43.0–77.0)
Platelets: 244 10*3/uL (ref 150.0–400.0)
RBC: 3.71 Mil/uL — ABNORMAL LOW (ref 3.87–5.11)
RDW: 15.3 % — ABNORMAL HIGH (ref 11.5–14.6)
WBC: 7.4 10*3/uL (ref 4.5–10.5)

## 2012-08-21 LAB — BASIC METABOLIC PANEL
BUN: 22 mg/dL (ref 6–23)
CO2: 29 mEq/L (ref 19–32)
Calcium: 8.8 mg/dL (ref 8.4–10.5)
Chloride: 103 mEq/L (ref 96–112)
Creatinine, Ser: 1.5 mg/dL — ABNORMAL HIGH (ref 0.4–1.2)
GFR: 45.32 mL/min — ABNORMAL LOW (ref >60.00)
Glucose, Bld: 415 mg/dL — ABNORMAL HIGH (ref 70–99)
Potassium: 3.8 mEq/L (ref 3.5–5.1)
Sodium: 138 mEq/L (ref 135–145)

## 2012-08-21 LAB — BRAIN NATRIURETIC PEPTIDE: Pro B Natriuretic peptide (BNP): 1080 pg/mL — ABNORMAL HIGH (ref 0.0–100.0)

## 2012-08-21 LAB — HEMOGLOBIN A1C: Hgb A1c MFr Bld: 13.1 % — ABNORMAL HIGH (ref 4.6–6.5)

## 2012-08-21 MED ORDER — POTASSIUM CHLORIDE CRYS ER 20 MEQ PO TBCR: 20.0000 meq | EXTENDED_RELEASE_TABLET | Freq: Two times a day (BID) | ORAL | Status: DC

## 2012-08-21 MED ORDER — FUROSEMIDE 40 MG PO TABS: ORAL_TABLET | ORAL | Status: DC

## 2012-08-21 NOTE — Patient Instructions (Addendum)
We need to check labs today  We need to get another ultrasound of her heart  For now, stay on your current medicines  I need to get you back in to discuss with Dr. Shirlee Latch  Call the [facility] office at [phone] if you have any questions, problems or concerns.

## 2012-08-21 NOTE — Progress Notes (Signed)
Judith Shepherd Date of Birth: [birth year] Medical Record #[id number]  History of Present Illness: Judith Shepherd is seen back today for a follow up visit. She is seen for Dr. Shirlee Latch. She has multiple medical issues which include CAD by cath (small branch vessel involvement), HTN, uncontrolled DM pericardial effusion and diastolic CHF. Last set of labs show anemia, CKD and grossly elevated A1C. Last seen here last July. There was discussion at that time of possible biopsy for possible amyloidosis.   She called earlier in the week with progressive dyspnea - asking about the need for oxygen. This appointment was then arranged with me.   She comes in today. She is here with her daughter. She is here because of shortness of breath. Says she needs oxygen. Says she has been going to her regular doctor and "no one is listening to me". Looks like her last visit with her PCP was back in October. No coughing. May have some PND. No chest pain. Seems more limited by her vision. Occasional swelling. Does get lightheaded and dizzy at times. Sounds like she does not restrict her salt use. Daughter, Joni Reining [phone]), says she is "stubborn". Has apparently refused work up for amyloid - daughter was unaware of this. Has had no medicines today. Does not check her blood pressure at home. Says her sugars are fine.    Current Outpatient Prescriptions on File Prior to Visit  Medication Sig Dispense Refill  . amLODipine-atorvastatin (CADUET) 10-80 MG per tablet Take 1 tablet by mouth every other day.        Marland Kitchen aspirin 325 MG tablet Take 325 mg by mouth daily.      . carvedilol (COREG) 25 MG tablet Take 1 tablet (25 mg total) by mouth 2 (two) times daily with a meal.  60 tablet  5  . ferrous sulfate 325 (65 FE) MG tablet Take 1 tablet (325 mg total) by mouth 3 (three) times daily with meals.  30 tablet  11  . furosemide (LASIX) 40 MG tablet Take 1 tablet (40 mg total) by mouth 3 (three) times daily. FOR 7 DAYS ONLY, THEN  RESUME LASIX 40 MG TWICE DAILY  90 tablet  6  . gabapentin (NEURONTIN) 300 MG capsule Take 1 capsule (300 mg total) by mouth at bedtime.  30 capsule  2  . insulin detemir (LEVEMIR) 100 UNIT/ML injection Inject 25 Units into the skin every morning.  15 mL  3  . Insulin Pen Needle 31G X 5 MM MISC 1 each by Does not apply route daily.  100 each  1  . lisinopril (PRINIVIL,ZESTRIL) 20 MG tablet TAKE ONE TABLET BY MOUTH EVERY DAY  30 tablet  3  . loratadine (CLARITIN) 10 MG tablet Take 1 tablet (10 mg total) by mouth daily.  30 tablet  1  . potassium chloride SA (K-DUR,KLOR-CON) 20 MEQ tablet TAKE 20 MEQ TWICE DAILY FOR 3 DAYS ONLY THEN STAY ON POTASSIUM 20 MEQ DAILY  75 tablet  6  . traMADol (ULTRAM) 50 MG tablet TAKE ONE TABLET BY MOUTH EVERY 6 HOURS AS NEEDED FOR PAIN  45 tablet  0   No current facility-administered medications on file prior to visit.    No Known Allergies  Past Medical History  Diagnosis Date  . 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 CAD 10/2008 cath    Adenosine myoview (5/10): EF 38%, mild anterior ischemia. LHC/RHC (5/10): mean RA 3, mean PCWP 5, EF 65%. Diffuse CAD in small branch vessels, consistent with  diabetes. No significant stenoses in the major vessels.   . Chronic diastolic heart failure     Probably diastolic. EF 65% on echo (myoview read as 38% but may be inaccurate). Echo (5/10) with mod-severe LVH, EF 65%, diastolic dysfunction with evidence for increased LV filling pressure, no significant AS, mild LAE.  Echo 4/13:  Severe LVH, EF 60-65%, grade 2 diast dysfxn, mild LAE, mod pericardial eff with mild R atrial chamber collapse, myocardium with speckled appearance; consider amyloid.  . Arthritis     R>L knee, declines TKR  . ANEMIA   . Diabetes mellitus, type 2     uncontrolled, med noncompliance  . DYSLIPIDEMIA   . HYPERTENSION   . CARPAL TUNNEL SYNDROME, BILATERAL     Past Medical History:  1. DM2  2. HTN  3. Obesity  4. Hyperlipidemia  5.  Diabetic peripheral neuropathy  6. CAD: Adenosine myoview (5/10): EF 38%, mild anterior ischemia. LHC/RHC (5/10): mean RA 3, mean PCWP 5, EF 65%. Diffuse CAD in small branch vessels, consistent with diabetes. No significant stenoses in the major vessels.  7. CHF: Diastolic. Echo (5/10) with mod-severe LVH, EF 65%, diastolic dysfunction with evidence for increased LV filling pressure, no significant AS, mild LAE. Echo (6/13): Moderate circumferential pericardial effusion without tamponade (IVC has normal respirophasic variation), moderate to severe LVH with EF 65%, mild RVH, diastolic dysfunction. Appearance of LV on echo concerning for amyloidosis. Serum immunofixation with polyclonal gammopathy.  8. CKD   Past Surgical History  Procedure Laterality Date  . Abdominal hysterectomy      History  Smoking status  . Never Smoker   Smokeless tobacco  . 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 Heart disease Mother   . Diabetes Mother   . Diabetes Sister   . Cancer Sister   . Diabetes Sister     Review of Systems: The review of systems is per the HPI.  All other systems were reviewed and are negative.  Physical Exam: BP 142/94  Pulse 84  Ht 5\' 6"  (1.676 m)  Wt 219 lb 6.4 oz (99.519 kg)  BMI 35.43 kg/m2  SpO2 94% Oxygen sats with walking in the office do not drop below 93%.  Patient is alert and in no acute distress. She is obese. Skin is warm and dry. She looks pale to me.  HEENT is unremarkable. Normocephalic/atraumatic. PERRL. Sclera are nonicteric. Neck is supple. No masses. No JVD. Lungs are clear. Cardiac exam shows a regular rate and rhythm. No murmur or rub noted. Abdomen is obese but soft. Extremities are with trace edema. Gait and ROM are intact. No gross neurologic deficits noted.  LABORATORY DATA:  Lab Results  Component Value Date   WBC 9.2  04/24/2012   HGB 9.3* 04/24/2012   HCT 29.8* 04/24/2012   PLT 246.0 04/24/2012   GLUCOSE 365* 04/24/2012   CHOL 200 09/16/2011   TRIG 122.0 09/16/2011   HDL 51.70 09/16/2011   LDLDIRECT 130.7 02/23/2010   LDLCALC 124* 09/16/2011   ALT 9 10/10/2011   AST 13 10/10/2011   NA 140 04/24/2012   K 4.4 04/24/2012   CL 101 04/24/2012   CREATININE 1.6* 04/24/2012   BUN 25* 04/24/2012   CO2 33* 04/24/2012   TSH 2.036 10/10/2011   INR 0.9 ratio 11/23/2008   HGBA1C 12.3* 04/24/2012  MICROALBUR 4.9* 02/23/2010    Echo Study Conclusions  - Left ventricle: The cavity size was normal. Wall thickness was increased in a pattern of moderate to severe LVH. The estimated ejection fraction was 65%. Wall motion was normal; there were no regional wall motion abnormalities. Findings consistent with left ventricular diastolic dysfunction. Doppler parameters are consistent with high ventricular filling pressure. - Aortic valve: Sclerosis without stenosis. - Left atrium: The atrium was moderately dilated. - Pericardium, extracardiac: Moderate circunferential pericardial effusion. - Impressions: The quality of the study today is better than 09/2011. The pericardial effusion persists. It is probably not changed in size, but exact comparison can not be made. There is an image suggesting that the IVC is not markedly dilated and seems to collapse significantly. Impressions:  - The quality of the study today is better than 09/2011. The pericardial effusion persists. It is probably not changed in size, but exact comparison can not be made. There is an image suggesting that the IVC is not markedly dilated and seems to collapse significantly.   Assessment / Plan: 1. Dyspnea - most likely mulitfactorial (diastolic heart failure/obesity/amyloid/anemia, etc) - this seems to be a chronic complaint. She has not kept her regular follow up.We are checking labs today and will go ahead and update her echo. I have left her  on her current medical regime for now as well. Needs to get back in for discussion with Dr. Shirlee Latch. I am not convinced that she understands her medical issues. She does not need oxygen at this point.  I have advised her daughter to come to her appointment with Dr. Shirlee Latch.   2. Obesity   3. HTN - no meds yet today.   4. Uncontrolled DM - rechecking A1C  5. CAD - small vessel disease per prior cath - no chest pain noted.  6. Possible amyloid - with past refusal for evaluation  7. Anemia - recheck her labs today.   Patient is agreeable to this plan and will call if any problems develop in the interim.

## 2012-08-24 ENCOUNTER — Ambulatory Visit: Payer: Medicare Other | Admitting: Internal Medicine

## 2012-08-31 ENCOUNTER — Other Ambulatory Visit: Payer: Self-pay | Admitting: *Deleted

## 2012-08-31 ENCOUNTER — Other Ambulatory Visit (INDEPENDENT_AMBULATORY_CARE_PROVIDER_SITE_OTHER): Payer: Medicare Other

## 2012-08-31 ENCOUNTER — Ambulatory Visit (HOSPITAL_COMMUNITY): Payer: Medicare Other | Attending: Nurse Practitioner | Admitting: Radiology

## 2012-08-31 DIAGNOSIS — R0609 Other forms of dyspnea: Secondary | ICD-10-CM | POA: Insufficient documentation

## 2012-08-31 DIAGNOSIS — E119 Type 2 diabetes mellitus without complications: Secondary | ICD-10-CM | POA: Insufficient documentation

## 2012-08-31 DIAGNOSIS — R899 Unspecified abnormal finding in specimens from other organs, systems and tissues: Secondary | ICD-10-CM

## 2012-08-31 DIAGNOSIS — I319 Disease of pericardium, unspecified: Secondary | ICD-10-CM

## 2012-08-31 DIAGNOSIS — R0600 Dyspnea, unspecified: Secondary | ICD-10-CM

## 2012-08-31 DIAGNOSIS — R0989 Other specified symptoms and signs involving the circulatory and respiratory systems: Secondary | ICD-10-CM | POA: Insufficient documentation

## 2012-08-31 DIAGNOSIS — I10 Essential (primary) hypertension: Secondary | ICD-10-CM

## 2012-08-31 DIAGNOSIS — R0602 Shortness of breath: Secondary | ICD-10-CM

## 2012-08-31 LAB — BASIC METABOLIC PANEL
BUN: 24 mg/dL — ABNORMAL HIGH (ref 6–23)
CO2: 27 mEq/L (ref 19–32)
Calcium: 8.1 mg/dL — ABNORMAL LOW (ref 8.4–10.5)
Chloride: 97 mEq/L (ref 96–112)
Creatinine, Ser: 1.7 mg/dL — ABNORMAL HIGH (ref 0.4–1.2)
GFR: 39.69 mL/min — ABNORMAL LOW (ref >60.00)
Glucose, Bld: 314 mg/dL — ABNORMAL HIGH (ref 70–99)
Potassium: 3.8 mEq/L (ref 3.5–5.1)
Sodium: 136 mEq/L (ref 135–145)

## 2012-08-31 NOTE — Progress Notes (Signed)
Echocardiogram performed.  

## 2012-09-10 ENCOUNTER — Ambulatory Visit (INDEPENDENT_AMBULATORY_CARE_PROVIDER_SITE_OTHER): Payer: Medicare Other | Admitting: Internal Medicine

## 2012-09-10 ENCOUNTER — Encounter: Payer: Self-pay | Admitting: Internal Medicine

## 2012-09-10 VITALS — BP 160/88 | HR 81 | Temp 97.6°F

## 2012-09-10 DIAGNOSIS — R05 Cough: Secondary | ICD-10-CM

## 2012-09-10 DIAGNOSIS — J019 Acute sinusitis, unspecified: Secondary | ICD-10-CM

## 2012-09-10 MED ORDER — HYDROCODONE-HOMATROPINE 5-1.5 MG/5ML PO SYRP: 5.0000 mL | ORAL_SOLUTION | Freq: Four times a day (QID) | ORAL | Status: DC | PRN

## 2012-09-10 MED ORDER — LEVOFLOXACIN 500 MG PO TABS: 500.0000 mg | ORAL_TABLET | Freq: Every day | ORAL | Status: DC

## 2012-09-10 NOTE — Progress Notes (Signed)
HPI  Pt presents to the clinic today with c/o cough and nasal congestion for 2 weeks. She has tried taking OTC like robitussin, coriciden and cough drops but nothing has helped. She has been running fevers (subjective). She does have a cough that does sometime produce green sputum, mostly first thing in the morning. Her cough is worse at night. She is having difficulty sleeping. She does have a history of allergies but not asthma. She has had sick contacts.  Review of Systems    Past Medical History  Diagnosis Date  . 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 CAD 10/2008 cath    Adenosine myoview (5/10): EF 38%, mild anterior ischemia. LHC/RHC (5/10): mean RA 3, mean PCWP 5, EF 65%. Diffuse CAD in small branch vessels, consistent with diabetes. No significant stenoses in the major vessels.   . Chronic diastolic heart failure     Probably diastolic. EF 65% on echo (myoview read as 38% but may be inaccurate). Echo (5/10) with mod-severe LVH, EF 65%, diastolic dysfunction with evidence for increased LV filling pressure, no significant AS, mild LAE.  Echo 4/13:  Severe LVH, EF 60-65%, grade 2 diast dysfxn, mild LAE, mod pericardial eff with mild R atrial chamber collapse, myocardium with speckled appearance; consider amyloid.  . Arthritis     R>L knee, declines TKR  . ANEMIA   . Diabetes mellitus, type 2     uncontrolled, med noncompliance  . DYSLIPIDEMIA   . HYPERTENSION   . CARPAL TUNNEL SYNDROME, BILATERAL     Family History  Problem Relation Age of Onset  . Heart disease Mother   . Diabetes Mother   . Diabetes Sister   . Cancer Sister   . Diabetes Sister     History   Social History  . Marital Status: Divorced    Spouse Name: N/A    Number of Children: N/A  . Years of Education: N/A   Occupational History  . Not on file.   Social History Main Topics  . Smoking status: Never Smoker   . 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 Alcohol Use: No  . Drug Use: No  . Sexually Active: No   Other Topics Concern  . 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 Denies abrupt weight changes.  HEENT:  Positive eye pain, pressure behind the eyes, facial pain, nasal congestion and sore throat. Denies eye redness, ear pain, ringing in the ears, wax buildup, runny nose or bloody nose. Respiratory: Positive cough and thick green sputum production. Denies difficulty breathing or shortness of breath.  Cardiovascular: Denies chest pain, chest tightness, palpitations or swelling in the hands or feet.   No other specific complaints in a complete review of systems (except as listed in HPI above).  Objective:    BP 160/88  Pulse 81  Temp(Src) 97.6 F (36.4 C) (Oral)  SpO2 96% Wt Readings from Last 3 Encounters:  08/21/12 219 lb 6.4 oz (99.519 kg)  04/24/12 213 lb (96.616 kg)  01/13/12 212 lb 9.6 oz (96.435 kg)    General: Appears her stated age, well developed, well nourished in NAD. HEENT: Head: normal shape and size, sinuses tender to palpation; Eyes: sclera white, no icterus, conjunctiva pink, PERRLA and EOMs intact; Ears: Tm's  gray and intact, normal light reflex; Nose: mucosa pink and moist, septum midline; Throat/Mouth: + PND. Teeth present, mucosa pink and moist, no exudate noted, no lesions or ulcerations noted.  Neck: Mild cervical lymphadenopathy. Neck supple, trachea midline. No massses, lumps or thyromegaly present.  Cardiovascular: Normal rate and rhythm. S1,S2 noted.  No murmur, rubs or gallops noted. No JVD or BLE edema. No carotid bruits noted. Pulmonary/Chest: Normal effort and positive vesicular breath sounds. No respiratory distress. No wheezes, rales or ronchi noted.      Assessment & Plan:   Acute bacterial sinusitis  Can use  a Neti Pot which can be purchased from your local drug store. eRx Levaquin x 7 days eRx for Hycodan cough syrup  RTC as needed or if symptoms persist.

## 2012-09-10 NOTE — Patient Instructions (Signed)

## 2012-09-18 ENCOUNTER — Telehealth: Payer: Self-pay | Admitting: *Deleted

## 2012-09-18 DIAGNOSIS — J019 Acute sinusitis, unspecified: Secondary | ICD-10-CM

## 2012-09-18 MED ORDER — LEVOFLOXACIN 500 MG PO TABS: 500.0000 mg | ORAL_TABLET | Freq: Every day | ORAL | Status: DC

## 2012-09-18 MED ORDER — HYDROCODONE-HOMATROPINE 5-1.5 MG/5ML PO SYRP: 5.0000 mL | ORAL_SOLUTION | Freq: Four times a day (QID) | ORAL | Status: DC | PRN

## 2012-09-18 NOTE — Telephone Encounter (Signed)
Done, Pt informed

## 2012-09-18 NOTE — Telephone Encounter (Signed)
Pt left vm requesting Rf on the Abx and cough medicine she was given at last OV.

## 2012-09-18 NOTE — Telephone Encounter (Signed)
Pearla Dubonnet to refill both [address]

## 2012-09-23 ENCOUNTER — Ambulatory Visit: Payer: Medicare Other | Admitting: Cardiology

## 2012-12-21 ENCOUNTER — Other Ambulatory Visit: Payer: Self-pay | Admitting: Internal Medicine

## 2012-12-23 ENCOUNTER — Other Ambulatory Visit: Payer: Self-pay

## 2013-01-04 ENCOUNTER — Telehealth: Payer: Self-pay | Admitting: *Deleted

## 2013-01-04 NOTE — Telephone Encounter (Signed)
Needs OV.  

## 2013-01-04 NOTE — Telephone Encounter (Signed)
Fax sent over from [facility] [place] for refill on Levofloxacin 500mg  tab, PO once daily.  Original Rx written on 3.13.2014.  Please advise

## 2013-01-05 ENCOUNTER — Telehealth: Payer: Self-pay | Admitting: Internal Medicine

## 2013-01-05 NOTE — Telephone Encounter (Signed)
i would prefer if she stay with Dr. Felicity Coyer. She has a very complicated history.

## 2013-01-05 NOTE — Telephone Encounter (Signed)
Ok with me either way

## 2013-01-05 NOTE — Telephone Encounter (Signed)
Called pt, cant leave vm, will try again.

## 2013-01-05 NOTE — Telephone Encounter (Signed)
Spoke with pt advised her she needs an appointment.  Transferred her to scheduling for appointment.

## 2013-01-05 NOTE — Telephone Encounter (Signed)
Pt request to transfer from Dr. Felicity Coyer to [place]. Please advise.

## 2013-02-08 ENCOUNTER — Other Ambulatory Visit (INDEPENDENT_AMBULATORY_CARE_PROVIDER_SITE_OTHER): Payer: Medicare Other

## 2013-02-08 ENCOUNTER — Ambulatory Visit (INDEPENDENT_AMBULATORY_CARE_PROVIDER_SITE_OTHER): Payer: Medicare Other | Admitting: Internal Medicine

## 2013-02-08 ENCOUNTER — Encounter: Payer: Self-pay | Admitting: Internal Medicine

## 2013-02-08 VITALS — BP 160/82 | HR 80 | Temp 97.5°F | Wt 214.6 lb

## 2013-02-08 DIAGNOSIS — R899 Unspecified abnormal finding in specimens from other organs, systems and tissues: Secondary | ICD-10-CM

## 2013-02-08 DIAGNOSIS — E1165 Type 2 diabetes mellitus with hyperglycemia: Secondary | ICD-10-CM

## 2013-02-08 DIAGNOSIS — J309 Allergic rhinitis, unspecified: Secondary | ICD-10-CM | POA: Insufficient documentation

## 2013-02-08 DIAGNOSIS — E1139 Type 2 diabetes mellitus with other diabetic ophthalmic complication: Secondary | ICD-10-CM

## 2013-02-08 DIAGNOSIS — I10 Essential (primary) hypertension: Secondary | ICD-10-CM

## 2013-02-08 DIAGNOSIS — E785 Hyperlipidemia, unspecified: Secondary | ICD-10-CM

## 2013-02-08 DIAGNOSIS — R6889 Other general symptoms and signs: Secondary | ICD-10-CM

## 2013-02-08 LAB — BASIC METABOLIC PANEL
BUN: 27 mg/dL — ABNORMAL HIGH (ref 6–23)
CO2: 31 mEq/L (ref 19–32)
Calcium: 9.1 mg/dL (ref 8.4–10.5)
Chloride: 101 mEq/L (ref 96–112)
Creatinine, Ser: 1.5 mg/dL — ABNORMAL HIGH (ref 0.4–1.2)
GFR: 43.22 mL/min — ABNORMAL LOW (ref >60.00)
Glucose, Bld: 219 mg/dL — ABNORMAL HIGH (ref 70–99)
Potassium: 4 mEq/L (ref 3.5–5.1)
Sodium: 140 mEq/L (ref 135–145)

## 2013-02-08 LAB — HEMOGLOBIN A1C: Hgb A1c MFr Bld: 12.6 % — ABNORMAL HIGH (ref 4.6–6.5)

## 2013-02-08 LAB — LIPID PANEL
HDL: 40.1 mg/dL (ref >39.00)
Total CHOL/HDL Ratio: 5
VLDL: 23.6 mg/dL (ref 0.0–40.0)

## 2013-02-08 LAB — MICROALBUMIN / CREATININE URINE RATIO: Microalb Creat Ratio: 20.2 mg/g (ref 0.0–30.0)

## 2013-02-08 LAB — LDL CHOLESTEROL, DIRECT: Direct LDL: 148.3 mg/dL

## 2013-02-08 MED ORDER — ATORVASTATIN CALCIUM 40 MG PO TABS: 40.0000 mg | ORAL_TABLET | Freq: Every day | ORAL | Status: DC

## 2013-02-08 MED ORDER — CARVEDILOL 25 MG PO TABS: 25.0000 mg | ORAL_TABLET | Freq: Two times a day (BID) | ORAL | Status: DC

## 2013-02-08 MED ORDER — FLUTICASONE PROPIONATE 50 MCG/ACT NA SUSP: 2.0000 | Freq: Every day | NASAL | Status: DC

## 2013-02-08 MED ORDER — AMLODIPINE BESYLATE 10 MG PO TABS: 10.0000 mg | ORAL_TABLET | Freq: Every day | ORAL | Status: DC

## 2013-02-08 MED ORDER — INSULIN DETEMIR 100 UNIT/ML ~~LOC~~ SOLN: 25.0000 [IU] | Freq: Two times a day (BID) | SUBCUTANEOUS | Status: DC

## 2013-02-08 MED ORDER — LISINOPRIL 20 MG PO TABS: 20.0000 mg | ORAL_TABLET | Freq: Every day | ORAL | Status: DC

## 2013-02-08 NOTE — Assessment & Plan Note (Signed)
BP Readings from Last 3 Encounters:  02/08/13 160/82  09/10/12 160/88  08/21/12 142/94   The current medical regimen's success is limited by non compliance;  Reviewed importance of compliance - pt agrees to consider continue present plan and medications and requested participation in pt's prescribed regimen

## 2013-02-08 NOTE — Addendum Note (Signed)
Addended by: Rene Paci A on: 02/08/2013 05:54 PM   Modules accepted: Orders

## 2013-02-08 NOTE — Assessment & Plan Note (Signed)
Complicated by noncompliance - working intermittently with endocrine ([facility]) Working with [facility] (optho) on retinopathy tx Changed to Levemir 09/2011, no longer on oral meds again, pleaded for med compliance and reviewed consquences of med noncompliance and uncontrolled DM -  pt with limited acceptance of information reminded exercise and diet unlikely to provided adequate medical control of DM but encouraged to comply with plans for same in addition to meds Also prescribed ACEI, ASA 81, statin - but variable compliance  Lab Results  Component Value Date   HGBA1C 13.1* 08/21/2012

## 2013-02-08 NOTE — Progress Notes (Signed)
Subjective:    Patient ID: Judith Shepherd, female    DOB: 01/08/1946, 67 y.o.   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   hypertension - reports variable compliance with ongoing medical treatment and no changes in medication dose or frequency. denies adverse side effects related to current therapy.  no headache or chest pain -  dyslipidemia - reports variable compliance with ongoing medical treatment and no changes in medication dose or frequency. denies adverse side effects related to current therapy.   Anemia, chronic dz - Denies BRBPR or epigastric pain - colo 03/2010 unremarkable -   Past Medical History  Diagnosis Date  . 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 CAD 10/2008 cath    Adenosine myoview (5/10): EF 38%, mild anterior ischemia. LHC/RHC (5/10): mean RA 3, mean PCWP 5, EF 65%. Diffuse CAD in small branch vessels, consistent with diabetes. No significant stenoses in the major vessels.   . Chronic diastolic heart failure     Probably diastolic. EF 65% on echo (myoview read as 38% but may be inaccurate). Echo (5/10) with mod-severe LVH, EF 65%, diastolic dysfunction with evidence for increased LV filling pressure, no significant AS, mild LAE.  Echo 4/13:  Severe LVH, EF 60-65%, grade 2 diast dysfxn, mild LAE, mod pericardial eff with mild R atrial chamber collapse, myocardium with speckled appearance; consider amyloid.  . Arthritis     R>L knee, declines TKR  . ANEMIA   . Diabetes mellitus, type 2      uncontrolled, med noncompliance  . DYSLIPIDEMIA   . HYPERTENSION   . CARPAL TUNNEL SYNDROME, BILATERAL    Family History  Problem Relation Age of Onset  . Heart disease Mother   . Diabetes Mother   . Diabetes Sister   . Cancer Sister   . Diabetes Sister    History  Substance Use Topics  . Smoking status: Never Smoker   . 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 Alcohol Use: No    Review of Systems  Constitutional: Positive for fatigue. Negative for fever.  Respiratory: Negative for cough and shortness of breath.   Cardiovascular: Negative for chest pain, palpitations and leg swelling.  Genitourinary: Negative for dysuria and urgency.      Objective:   Physical Exam  BP 160/82  Pulse 80  Temp(Src) 97.5 F (36.4 C) (Oral)  Wt 214 lb 9.6 oz (97.342 kg)  BMI 34.65 kg/m2  SpO2 91% Wt Readings from Last 3 Encounters:  02/08/13 214 lb 9.6 oz (97.342 kg)  08/21/12 219 lb 6.4 oz (99.519 kg)  04/24/12 213 lb (96.616 kg)   Constitutional: She is overweight; appears well-developed and well-nourished. No distress.  HENT: clear rhinnorhea, no sinus tenderness Cardiovascular: Normal rate, regular rhythm and normal heart sounds.  No murmur heard. trace BLE edema Pulmonary/Chest: Effort normal and breath sounds normal. No respiratory distress. She has no wheezes.    Lab Results  Component Value Date   WBC 7.4 08/21/2012  HGB 9.4* 08/21/2012   HCT 29.1* 08/21/2012   PLT 244.0 08/21/2012   CHOL 200 09/16/2011   TRIG 122.0 09/16/2011   HDL 51.70 09/16/2011   LDLDIRECT 130.7 02/23/2010   ALT 9 10/10/2011   AST 13 10/10/2011   NA 136 08/31/2012   K 3.8 08/31/2012   CL 97 08/31/2012   CREATININE 1.7* 08/31/2012   BUN 24* 08/31/2012   CO2 27 08/31/2012   TSH 2.036 10/10/2011   INR 0.9 ratio 11/23/2008   HGBA1C 13.1* 08/21/2012   MICROALBUR 4.9* 02/23/2010     Assessment & Plan:  See problem list. Medications and labs  reviewed today.

## 2013-02-08 NOTE — Assessment & Plan Note (Signed)
rx'd statin - variable compliance The current medical regimen is effective;  continue present plan and medications. Lab Results  Component Value Date   LDLCALC 124* 09/16/2011

## 2013-02-08 NOTE — Patient Instructions (Signed)
It was good to see you today. We have reviewed your prior records including labs and tests today Test(s) ordered today. Your results will be released to MyChart (or called to you) after review, usually within 72hours after test completion. If any changes need to be made, you will be notified at that same time. Medications reviewed and updated Add Flonase for allergy symptoms,no other changes recommended at this time. Your prescription(s) for 90-dy supply have been submitted to your pharmacy. Please take as directed and contact our office if you believe you are having problem(s) with the medication(s). Please schedule followup in 4 months for recheck, call sooner if problems.

## 2013-02-08 NOTE — Assessment & Plan Note (Signed)
Add Flonase nasal spray to ongoing daily Claritin Explained lack of efficacy for antibiotics when no infectious agent present

## 2013-02-08 NOTE — Progress Notes (Signed)
Subjective:    Patient ID: Judith Shepherd, female    DOB: 1945/08/02, 67 y.o.   MRN: [id number]  HPI Patient presents today for follow up for chronic medical conditions.   DM II- Patient reports compliance with Levemir. She checks her urine at home to monitor glycemic control, and she notes occ glucose. She denies polydypsia and polyuria.   Htn- Patient has not had Caudet or lisinopril in 1 month. She has been taking Coreg. She checks her BP occ and notes pressures in the 140's/80's. Today her BP is elevated at 160/82. She denies HA, chest pain, and new vision changes.  Hyperlipidemia- Patient has not had Caudet x 1 month. Lipids were last checked 3/13.  Patient also presents today with complaints of sinus congestion, clear rhinorrhea, throat drainage and watery eyes x 2-3 months when she began using A.C. She currently takes Claritin daily and mucinex prn with variable success. She denies fever and chills. She was seen 3-4 months ago and was diagnosed with acute sinusitis and prescribed an antibiotic, and so she requests refill on this antibiotic today.   Past Medical History  Diagnosis Date  . 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 CAD 10/2008 cath    Adenosine myoview (5/10): EF 38%, mild anterior ischemia. LHC/RHC (5/10): mean RA 3, mean PCWP 5, EF 65%. Diffuse CAD in small branch vessels, consistent with diabetes. No significant stenoses in the major vessels.   . Chronic diastolic heart failure     Probably diastolic. EF 65% on echo (myoview read as 38% but may be inaccurate). Echo (5/10) with mod-severe LVH, EF 65%, diastolic dysfunction with evidence for increased LV filling pressure, no significant AS, mild LAE.  Echo 4/13:  Severe LVH, EF 60-65%, grade 2 diast dysfxn, mild LAE, mod pericardial eff with mild R atrial chamber collapse, myocardium with speckled appearance; consider amyloid.  . Arthritis     R>L knee, declines TKR  . ANEMIA   . Diabetes mellitus, type 2    uncontrolled, med noncompliance  . DYSLIPIDEMIA   . HYPERTENSION   . CARPAL TUNNEL SYNDROME, BILATERAL    Family History  Problem Relation Age of Onset  . Heart disease Mother   . Diabetes Mother   . Diabetes Sister   . Cancer Sister   . Diabetes Sister    History  Substance Use Topics  . Smoking status: Never Smoker   . 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 Alcohol Use: No   Review of Systems  Constitutional: Negative for unexpected weight change.  HENT: Negative for sore throat.   Eyes:       Watery eyes  Respiratory: Positive for shortness of breath. Negative for cough.        SOB noted if she does not take dose of lasix  Cardiovascular: Negative for chest pain.  Skin: Negative for rash.  Neurological: Negative for dizziness and syncope.       Objective:   Physical Exam  Constitutional: She is oriented to person, place, and time. She appears well-developed and well-nourished.  HENT:  Head: Normocephalic and atraumatic.  Mouth/Throat: Oropharynx is clear and moist.  Eyes: Conjunctivae are normal. Pupils are equal, round, and reactive to light. Right eye exhibits no discharge. Left eye exhibits no discharge.  Neck: Normal range of motion. Neck supple. No tracheal deviation present. No thyromegaly present.  Cardiovascular: Normal rate and regular rhythm.   Pulmonary/Chest: Breath  sounds normal. She has no wheezes. She has no rales.  Musculoskeletal: She exhibits no edema.  Lymphadenopathy:    She has no cervical adenopathy.  Neurological: She is alert and oriented to person, place, and time. No cranial nerve deficit.  Skin: Skin is warm and dry.  Psychiatric: She has a normal mood and affect.   BP 160/82  Pulse 80  Temp(Src) 97.5 F (36.4 C) (Oral)  Wt 214 lb 9.6 oz (97.342 kg)  BMI 34.65 kg/m2  SpO2 91%  Wt Readings from Last 3 Encounters:  02/08/13 214 lb 9.6 oz (97.342 kg)  08/21/12  219 lb 6.4 oz (99.519 kg)  04/24/12 213 lb (96.616 kg)   Lab Results  Component Value Date   WBC 7.4 08/21/2012   HGB 9.4* 08/21/2012   HCT 29.1* 08/21/2012   PLT 244.0 08/21/2012   GLUCOSE 314* 08/31/2012   CHOL 200 09/16/2011   TRIG 122.0 09/16/2011   HDL 51.70 09/16/2011   LDLDIRECT 130.7 02/23/2010   LDLCALC 124* 09/16/2011   ALT 9 10/10/2011   AST 13 10/10/2011   NA 136 08/31/2012   K 3.8 08/31/2012   CL 97 08/31/2012   CREATININE 1.7* 08/31/2012   BUN 24* 08/31/2012   CO2 27 08/31/2012   TSH 2.036 10/10/2011   INR 0.9 ratio 11/23/2008   HGBA1C 13.1* 08/21/2012   MICROALBUR 4.9* 02/23/2010         Assessment & Plan:  1. DM II- Will obtain A1C and urine microalbumin/creatinine today to assess diabetes control and kidney health. Titrate as needed and reminded of importance of compliance with medications as prescribed. Patient will continue with Levemir as well as diet and exercise.   2. Htn- Refilled Caudet (change to separate components of amlodipine and atorva for cost to hopefully improve compliance) and lisinopril. Encouraged compliance with these medications.  3. Hyperlipidemia- Lipid panel will be obtained today. Restart statin   4. Allergic Rhinitis- Patient is to continue Claritin and mucinex. Flonase will be ordered for added relief. Patient was counseled about lack of efficacy of antibiotics for non-infectious etiology of symptoms.  Concha Se, [place] Neighbor  I have personally reviewed this case with PA student. I also personally examined this patient. I agree with history and findings as documented above. I reviewed, discussed and approve of the assessment and plan as listed above. Rene Paci, MD

## 2013-02-26 ENCOUNTER — Ambulatory Visit: Payer: Medicare Other | Admitting: Endocrinology

## 2013-05-04 ENCOUNTER — Other Ambulatory Visit: Payer: Self-pay

## 2013-05-12 ENCOUNTER — Other Ambulatory Visit: Payer: Self-pay | Admitting: *Deleted

## 2013-05-12 MED ORDER — INSULIN PEN NEEDLE 31G X 5 MM MISC: 1.0000 | Freq: Every day | Status: AC

## 2013-07-19 ENCOUNTER — Encounter: Payer: Self-pay | Admitting: Internal Medicine

## 2013-07-19 ENCOUNTER — Ambulatory Visit (INDEPENDENT_AMBULATORY_CARE_PROVIDER_SITE_OTHER): Payer: Medicare Other | Admitting: Internal Medicine

## 2013-07-19 ENCOUNTER — Telehealth: Payer: Self-pay | Admitting: Internal Medicine

## 2013-07-19 ENCOUNTER — Ambulatory Visit (INDEPENDENT_AMBULATORY_CARE_PROVIDER_SITE_OTHER)
Admission: RE | Admit: 2013-07-19 | Discharge: 2013-07-19 | Disposition: A | Discharge status: Home / Self Care | Payer: Medicare Other | Source: Ambulatory Visit | Attending: Internal Medicine | Admitting: Internal Medicine

## 2013-07-19 ENCOUNTER — Other Ambulatory Visit (INDEPENDENT_AMBULATORY_CARE_PROVIDER_SITE_OTHER): Payer: Medicare Other

## 2013-07-19 VITALS — BP 148/90 | HR 72 | Temp 99.1°F | Wt 215.1 lb

## 2013-07-19 DIAGNOSIS — E1165 Type 2 diabetes mellitus with hyperglycemia: Secondary | ICD-10-CM

## 2013-07-19 DIAGNOSIS — J189 Pneumonia, unspecified organism: Secondary | ICD-10-CM

## 2013-07-19 DIAGNOSIS — E1129 Type 2 diabetes mellitus with other diabetic kidney complication: Secondary | ICD-10-CM

## 2013-07-19 DIAGNOSIS — R05 Cough: Secondary | ICD-10-CM

## 2013-07-19 DIAGNOSIS — R059 Cough, unspecified: Secondary | ICD-10-CM

## 2013-07-19 DIAGNOSIS — E1139 Type 2 diabetes mellitus with other diabetic ophthalmic complication: Secondary | ICD-10-CM

## 2013-07-19 LAB — BASIC METABOLIC PANEL
BUN: 23 mg/dL (ref 6–23)
CALCIUM: 8.8 mg/dL (ref 8.4–10.5)
CHLORIDE: 103 meq/L (ref 96–112)
CO2: 31 meq/L (ref 19–32)
Creatinine, Ser: 1.7 mg/dL — ABNORMAL HIGH (ref 0.4–1.2)
GFR: 39.04 mL/min — ABNORMAL LOW (ref >60.00)
GLUCOSE: 295 mg/dL — AB (ref 70–99)
Potassium: 3.6 mEq/L (ref 3.5–5.1)
Sodium: 141 mEq/L (ref 135–145)

## 2013-07-19 LAB — CBC WITH DIFFERENTIAL/PLATELET
Basophils Absolute: 0 10*3/uL (ref 0.0–0.1)
Basophils Relative: 0.3 % (ref 0.0–3.0)
EOS ABS: 0.1 10*3/uL (ref 0.0–0.7)
Eosinophils Relative: 1.6 % (ref 0.0–5.0)
HCT: 31.4 % — ABNORMAL LOW (ref 36.0–46.0)
Hemoglobin: 10.1 g/dL — ABNORMAL LOW (ref 12.0–15.0)
LYMPHS PCT: 11.6 % — AB (ref 12.0–46.0)
Lymphs Abs: 1.1 10*3/uL (ref 0.7–4.0)
MCHC: 32.3 g/dL (ref 30.0–36.0)
MCV: 80.1 fl (ref 78.0–100.0)
MONO ABS: 0.4 10*3/uL (ref 0.1–1.0)
Monocytes Relative: 4.3 % (ref 3.0–12.0)
NEUTROS PCT: 82.2 % — AB (ref 43.0–77.0)
Neutro Abs: 7.6 10*3/uL (ref 1.4–7.7)
PLATELETS: 264 10*3/uL (ref 150.0–400.0)
RBC: 3.93 Mil/uL (ref 3.87–5.11)
RDW: 15.2 % — ABNORMAL HIGH (ref 11.5–14.6)
WBC: 9.3 10*3/uL (ref 4.5–10.5)

## 2013-07-19 LAB — HEMOGLOBIN A1C: Hgb A1c MFr Bld: 13.6 % — ABNORMAL HIGH (ref 4.6–6.5)

## 2013-07-19 MED ORDER — BENZONATATE 200 MG PO CAPS: 200.0000 mg | ORAL_CAPSULE | Freq: Three times a day (TID) | ORAL | Status: DC | PRN

## 2013-07-19 MED ORDER — LEVOFLOXACIN 500 MG PO TABS: 500.0000 mg | ORAL_TABLET | Freq: Every day | ORAL | Status: DC

## 2013-07-19 NOTE — Patient Instructions (Addendum)
It was good to see you today.  We have reviewed your prior records including labs and tests today  Test(s) ordered today. Your results will be released to MyChart (or called to you) after review, usually within 72hours after test completion. If any changes need to be made, you will be notified at that same time.  Medications reviewed and updated  Levaquin antibiotics daily for one week, Tessalon 3 times daily as needed for cough. No other changes recommended  Your prescription(s) have been submitted to your pharmacy. Please take as directed and contact our office if you believe you are having problem(s) with the medication(s).  Followup in 2 weeks for continue review, please call sooner if worse  Make referral to home health for safety evaluation to help with your vision loss. My office will call regarding these appointments once arranged  Cough, Adult  A cough is a reflex that helps clear your throat and airways. It can help heal the body or may be a reaction to an irritated airway. A cough may only last 2 or 3 weeks (acute) or may last more than 8 weeks (chronic).  CAUSES Acute cough:  Viral or bacterial infections. Chronic cough:  Infections.  Allergies.  Asthma.  Post-nasal drip.  Smoking.  Heartburn or acid reflux.  Some medicines.  Chronic lung problems (COPD).  Cancer. SYMPTOMS   Cough.  Fever.  Chest pain.  Increased breathing rate.  High-pitched whistling sound when breathing (wheezing).  Colored mucus that you cough up (sputum). TREATMENT   A bacterial cough may be treated with antibiotic medicine.  A viral cough must run its course and will not respond to antibiotics.  Your caregiver may recommend other treatments if you have a chronic cough. HOME CARE INSTRUCTIONS   Only take over-the-counter or prescription medicines for pain, discomfort, or fever as directed by your caregiver. Use cough suppressants only as directed by your  caregiver.  Use a cold steam vaporizer or humidifier in your bedroom or home to help loosen secretions.  Sleep in a semi-upright position if your cough is worse at night.  Rest as needed.  Stop smoking if you smoke. SEEK IMMEDIATE MEDICAL CARE IF:   You have pus in your sputum.  Your cough starts to worsen.  You cannot control your cough with suppressants and are losing sleep.  You begin coughing up blood.  You have difficulty breathing.  You develop pain which is getting worse or is uncontrolled with medicine.  You have a fever. MAKE SURE YOU:   Understand these instructions.  Will watch your condition.  Will get help right away if you are not doing well or get worse. Document Released: 12/14/2010 Document Revised: 09/09/2011 Document Reviewed: 12/14/2010 [facility] Patient Information 2014 [place], [place]. Diabetes and Foot Care Diabetes may cause you to have problems because of poor blood supply (circulation) to your feet and legs. This may cause the skin on your feet to become thinner, break easier, and heal more slowly. Your skin may become dry, and the skin may peel and crack. You may also have nerve damage in your legs and feet causing decreased feeling in them. You may not notice minor injuries to your feet that could lead to infections or more serious problems. Taking care of your feet is one of the most important things you can do for yourself.  HOME CARE INSTRUCTIONS  Wear shoes at all times, even in the house. Do not go barefoot. Bare feet are easily injured.  Check your  feet daily for blisters, cuts, and redness. If you cannot see the bottom of your feet, use a mirror or ask someone for help.  Wash your feet with warm water (do not use hot water) and mild soap. Then pat your feet and the areas between your toes until they are completely dry. Do not soak your feet as this can dry your skin.  Apply a moisturizing lotion or petroleum jelly (that does not contain  alcohol and is unscented) to the skin on your feet and to dry, brittle toenails. Do not apply lotion between your toes.  Trim your toenails straight across. Do not dig under them or around the cuticle. File the edges of your nails with an emery board or nail file.  Do not cut corns or calluses or try to remove them with medicine.  Wear clean socks or stockings every day. Make sure they are not too tight. Do not wear knee-high stockings since they may decrease blood flow to your legs.  Wear shoes that fit properly and have enough cushioning. To break in new shoes, wear them for just a few hours a day. This prevents you from injuring your feet. Always look in your shoes before you put them on to be sure there are no objects inside.  Do not cross your legs. This may decrease the blood flow to your feet.  If you find a minor scrape, cut, or break in the skin on your feet, keep it and the skin around it clean and dry. These areas may be cleansed with mild soap and water. Do not cleanse the area with peroxide, alcohol, or iodine.  When you remove an adhesive bandage, be sure not to damage the skin around it.  If you have a wound, look at it several times a day to make sure it is healing.  Do not use heating pads or hot water bottles. They may burn your skin. If you have lost feeling in your feet or legs, you may not know it is happening until it is too late.  Make sure your health care provider performs a complete foot exam at least annually or more often if you have foot problems. Report any cuts, sores, or bruises to your health care provider immediately. SEEK MEDICAL CARE IF:   You have an injury that is not healing.  You have cuts or breaks in the skin.  You have an ingrown nail.  You notice redness on your legs or feet.  You feel burning or tingling in your legs or feet.  You have pain or cramps in your legs and feet.  Your legs or feet are numb.  Your feet always feel cold. SEEK  IMMEDIATE MEDICAL CARE IF:   There is increasing redness, swelling, or pain in or around a wound.  There is a red line that goes up your leg.  Pus is coming from a wound.  You develop a fever or as directed by your health care provider.  You notice a bad smell coming from an ulcer or wound. Document Released: 06/14/2000 Document Revised: 02/17/2013 Document Reviewed: 11/24/2012 [facility] Patient Information 2014 [place], [place].

## 2013-07-19 NOTE — Progress Notes (Signed)
Pre-visit discussion using our clinic review tool. No additional management support is needed unless otherwise documented below in the visit note.  

## 2013-07-19 NOTE — Telephone Encounter (Signed)
Notified pt with md response.../lmb 

## 2013-07-19 NOTE — Assessment & Plan Note (Signed)
Complicated by noncompliance - working intermittently with endocrine ([facility]) Working with [facility] (optho) on retinopathy tx Changed to Levemir 09/2011, no longer on oral meds again, pleaded for med compliance and reviewed consquences of med noncompliance and uncontrolled DM -  pt with limited acceptance of information reminded exercise and diet unlikely to provided adequate medical control of DM but encouraged to comply with plans for same in addition to meds Also prescribed ACEI, ASA 81, statin - but variable compliance  Lab Results  Component Value Date   HGBA1C 12.6* 02/08/2013   Advised followup with ophthalmology as ongoing Refer for home health evaluation for safety and treatment to assist with vision loss in regards to home safety

## 2013-07-19 NOTE — Progress Notes (Signed)
Judith Shepherd [id number] 18, 2047 07/19/2013  Chief Complaint  Patient presents with  . URI    Chest & nasal congestion  . Cough    Subjective  Cough This is a new problem. The current episode started 1 to 4 weeks ago. The problem has been gradually worsening. The problem occurs constantly. The cough is non-productive. Associated symptoms include chest pain, chills, ear congestion, ear pain, a fever (self reported - Patient doesn't remember exact number), headaches, myalgias, postnasal drip, rhinorrhea, a sore throat and shortness of breath. Pertinent negatives include no heartburn, hemoptysis, nasal congestion, rash, weight loss or wheezing. The symptoms are aggravated by exercise. She has tried rest and OTC cough suppressant for the symptoms. The treatment provided mild relief. Her past medical history is significant for pneumonia. There is no history of environmental allergies. Dyspnea and respiratory abnormality    Past Medical History  Diagnosis Date  . 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 CAD 10/2008 cath    Adenosine myoview (5/10): EF 38%, mild anterior ischemia. LHC/RHC (5/10): mean RA 3, mean PCWP 5, EF 65%. Diffuse CAD in small branch vessels, consistent with diabetes. No significant stenoses in the major vessels.   . Chronic diastolic heart failure     Probably diastolic. EF 65% on echo (myoview read as 38% but may be inaccurate). Echo (5/10) with mod-severe LVH, EF 65%, diastolic dysfunction with evidence for increased LV filling pressure, no significant AS, mild LAE.  Echo 4/13:  Severe LVH, EF 60-65%, grade 2 diast dysfxn, mild LAE, mod pericardial eff with mild R atrial chamber collapse, myocardium with speckled appearance; consider amyloid.  . Arthritis     R>L knee, declines TKR  . ANEMIA   . Diabetes mellitus, type 2     uncontrolled, med noncompliance  . DYSLIPIDEMIA   . HYPERTENSION   . CARPAL TUNNEL SYNDROME, BILATERAL     Past Surgical History  Procedure  Laterality Date  . Abdominal hysterectomy      Family History  Problem Relation Age of Onset  . Heart disease Mother   . Diabetes Mother   . Diabetes Sister   . Cancer Sister   . Diabetes Sister     History  Substance Use Topics  . Smoking status: Never Smoker   . Smokeless tobacco: Never Used     Comment: retired from city of [place], International aid/development workershuttle driver; lives alone, divorced  . Alcohol Use: No    Current Outpatient Prescriptions on File Prior to Visit  Medication Sig Dispense Refill  . amLODipine (NORVASC) 10 MG tablet Take 1 tablet (10 mg total) by mouth daily.  90 tablet  3  . aspirin 325 MG tablet Take 325 mg by mouth daily.      Marland Kitchen. atorvastatin (LIPITOR) 40 MG tablet Take 1 tablet (40 mg total) by mouth daily.  90 tablet  3  . carvedilol (COREG) 25 MG tablet Take 1 tablet (25 mg total) by mouth 2 (two) times daily with a meal.  180 tablet  3  . ferrous sulfate 325 (65 FE) MG tablet Take 1 tablet (325 mg total) by mouth 3 (three) times daily with meals.  30 tablet  11  . fluticasone (FLONASE) 50 MCG/ACT nasal spray Place 2 sprays into the nose daily.  16 g  2  . furosemide (LASIX) 40 MG tablet Take 80 mg in am and 40 mg in pm  90 tablet  6  . insulin detemir (LEVEMIR) 100 UNIT/ML injection Inject 0.25 mLs (25 Units total)  into the skin 2 (two) times daily.  30 mL  3  . Insulin Pen Needle 31G X 5 MM MISC 1 each by Does not apply route daily.  100 each  1  . lisinopril (PRINIVIL,ZESTRIL) 20 MG tablet Take 1 tablet (20 mg total) by mouth daily.  90 tablet  3  . potassium chloride SA (K-DUR,KLOR-CON) 20 MEQ tablet Take 1 tablet (20 mEq total) by mouth 2 (two) times daily.  60 tablet  6  . traMADol (ULTRAM) 50 MG tablet TAKE ONE TABLET BY MOUTH EVERY 6 HOURS AS NEEDED FOR PAIN  45 tablet  0  . gabapentin (NEURONTIN) 300 MG capsule Take 1 capsule (300 mg total) by mouth at bedtime.  30 capsule  2  . loratadine (CLARITIN) 10 MG tablet Take 1 tablet (10 mg total) by mouth daily.  30 tablet  1    No current facility-administered medications on file prior to visit.    Allergies: No Known Allergies  Review of Systems  Constitutional: Positive for fever (self reported - Patient doesn't remember exact number), chills and malaise/fatigue. Negative for weight loss and diaphoresis.  HENT: Positive for congestion, ear pain, postnasal drip, rhinorrhea and sore throat. Negative for ear discharge, hearing loss, nosebleeds and tinnitus.   Respiratory: Positive for cough and shortness of breath. Negative for hemoptysis, sputum production, wheezing and stridor.   Cardiovascular: Positive for chest pain. Negative for claudication and leg swelling.  Gastrointestinal: Negative for heartburn, nausea, vomiting, abdominal pain and diarrhea.  Musculoskeletal: Positive for joint pain (Chronic, right knee) and myalgias. Negative for back pain and neck pain.  Skin: Negative for rash.  Neurological: Positive for headaches. Negative for dizziness, tingling, tremors and weakness.  Endo/Heme/Allergies: Negative for environmental allergies. Does not bruise/bleed easily.       Objective  Filed Vitals:   07/19/13 0936  BP: 148/90  Pulse: 72  Temp: 99.1 F (37.3 C)  TempSrc: Oral  Weight: 215 lb 1.9 oz (97.578 kg)  SpO2: 90%    Physical Exam  Nursing note and vitals reviewed. Constitutional: She is oriented to person, place, and time. She appears well-developed and well-nourished. She is cooperative. She appears ill.  HENT:  Head: Normocephalic and atraumatic.  Right Ear: Hearing, external ear and ear canal normal. Tympanic membrane is injected.  Left Ear: Hearing, tympanic membrane, external ear and ear canal normal.  Nose: Right sinus exhibits maxillary sinus tenderness and frontal sinus tenderness. Left sinus exhibits maxillary sinus tenderness and frontal sinus tenderness.  Mouth/Throat: Uvula is midline. Mucous membranes are not pale, not dry and not cyanotic. Posterior oropharyngeal  erythema present. No oropharyngeal exudate.  Eyes: Conjunctivae and lids are normal. Pupils are equal, round, and reactive to light. No scleral icterus.  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   Left eye -  Minimal eyesight remaining.   Neck: No JVD present. No tracheal tenderness present. No tracheal deviation present. No thyromegaly present.  Cardiovascular: Normal rate, regular rhythm and normal heart sounds.  Exam reveals no gallop and no friction rub.   No murmur heard. Respiratory: Breath sounds normal. Tachypnea noted. No respiratory distress.  Lymphadenopathy:    She has no cervical adenopathy.    She has no axillary adenopathy.  Neurological: She is alert and oriented to person, place, and time.  Skin: Skin is warm and dry. No cyanosis.  Psychiatric: Her speech is normal and behavior is normal. Thought content normal. She exhibits a depressed mood.  BP Readings from Last 3 Encounters:  07/19/13 148/90  02/08/13 160/82  09/10/12 160/88    Wt Readings from Last 3 Encounters:  07/19/13 215 lb 1.9 oz (97.578 kg)  02/08/13 214 lb 9.6 oz (97.342 kg)  08/21/12 219 lb 6.4 oz (99.519 kg)    Lab Results  Component Value Date   WBC 7.4 08/21/2012   HGB 9.4* 08/21/2012   HCT 29.1* 08/21/2012   PLT 244.0 08/21/2012   GLUCOSE 219* 02/08/2013   CHOL 214* 02/08/2013   TRIG 118.0 02/08/2013   HDL 40.10 02/08/2013   LDLDIRECT 148.3 02/08/2013   LDLCALC 124* 09/16/2011   ALT 9 10/10/2011   AST 13 10/10/2011   NA 140 02/08/2013   K 4.0 02/08/2013   CL 101 02/08/2013   CREATININE 1.5* 02/08/2013   BUN 27* 02/08/2013   CO2 31 02/08/2013   TSH 2.036 10/10/2011   INR 0.9 ratio 11/23/2008   HGBA1C 12.6* 02/08/2013   MICROALBUR 16.9* 02/08/2013    Dg Chest Portable 1 View  10/10/2011   *RADIOLOGY REPORT*  Clinical Data: Shortness of breath and wheezing.  History of congestive heart failure.  PORTABLE CHEST - 1 VIEW  Comparison: Chest x-ray 11/16/2010.  Findings: Lung volumes  are normal.  There is a moderate - severe enlargement of the cardiopericardial silhouette which appears increased compared to the prior examination, and has a "water bottle" appearance, that could suggest an underlying pericardial effusion.  The left base is poorly visualized, such that underlying atelectasis and/or consolidation in the left lower lobe is difficult to exclude.  There may also be a small or moderate left- sided pleural effusion (however, these findings may all simply reflect the enlarged cardiopericardial silhouette).  Mild pulmonary venous congestion without frank pulmonary edema.  Right lung is clear.  Mediastinal contours are unremarkable.  IMPRESSION: 1.  Moderate - severe enlargement of the cardiopericardial silhouette which has an appearance that could suggest the presence of an enlarging pericardial effusion. Clinical correlation is recommended, with consideration for further evaluation with echocardiography if clinically indicated. 2.  Because the enlarged cardiopericardial silhouette the left base is poorly evaluated.  There may be an area of atelectasis and/or consolidation in the left lower lobe, and there may also be a small to moderate left-sided pleural effusion.  This could be better evaluated with standing PA and lateral chest radiographs if clinically indicated.  Original Report Authenticated By: Florencia Reasons, M.D.  Assessment and Plan   Cough -  Chest x-ray to evaluate low o2 stats and SOB,  Consider bacterial infection due to greater than 2 week symptoms ,  CBC with diff, BMET, \.  Will notify patient of results and change medication if needed.  Prescribed  Benzonatate 200mg  capsule, Doxycyline antibiotic.   Uncontrolled Type II diabetes.  HgA1c.  Will notify patient of results. Patient reports increasing her levemir dose to 25 units bid.  Will address issue at 2 week follow up.   Patient was instructed to notify the office if any new symptoms emerged or the current  symptoms become worse.  Return in about 2 weeks (around 08/02/2013). Jonna Coup  I have personally reviewed this case with PA student. I also personally examined this patient. I agree with history and findings as documented above. I reviewed, discussed and approve of the assessment and plan as listed above. Rene Paci, MD

## 2013-07-19 NOTE — Telephone Encounter (Signed)
Patient called stated she was seen today and wanted to know if an antibiotic can be called in  For pneumonia,but stated she dont know for sure if she has it but has the symptoms  Please advise

## 2013-07-19 NOTE — Telephone Encounter (Signed)
Antibiotic was sent at time of visit for her infection

## 2013-07-19 NOTE — Progress Notes (Signed)
  Subjective:    Patient ID: Judith Shepherd, female    DOB: 04/22/1946, 68 y.o.   MRN: [id number]  URI  Associated symptoms include coughing. Pertinent negatives include no chest pain or dysuria.  Cough Pertinent negatives include no chest pain, fever or shortness of breath.   Also reviewed chronic medical issues: Patient remains noncompliant with medications as rx'd   DM2, uncontrolled; hypertension; dyslipidemia; Anemia, chronic dz -   Past Medical History  Diagnosis Date  . 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 CAD 10/2008 cath    Adenosine myoview (5/10): EF 38%, mild anterior ischemia. LHC/RHC (5/10): mean RA 3, mean PCWP 5, EF 65%. Diffuse CAD in small branch vessels, consistent with diabetes. No significant stenoses in the major vessels.   . Chronic diastolic heart failure     Probably diastolic. EF 65% on echo (myoview read as 38% but may be inaccurate). Echo (5/10) with mod-severe LVH, EF 65%, diastolic dysfunction with evidence for increased LV filling pressure, no significant AS, mild LAE.  Echo 4/13:  Severe LVH, EF 60-65%, grade 2 diast dysfxn, mild LAE, mod pericardial eff with mild R atrial chamber collapse, myocardium with speckled appearance; consider amyloid.  . Arthritis     R>L knee, declines TKR  . ANEMIA   . Diabetes mellitus, type 2     uncontrolled, med noncompliance  . DYSLIPIDEMIA   . HYPERTENSION   . CARPAL TUNNEL SYNDROME, BILATERAL     Review of Systems  Constitutional: Positive for fatigue. Negative for fever.  Respiratory: Positive for cough. Negative for shortness of breath.   Cardiovascular: Negative for chest pain, palpitations and leg swelling.  Genitourinary: Negative for dysuria and urgency.      Objective:   Physical Exam BP 148/90  Pulse 72  Temp(Src) 99.1 F (37.3 C) (Oral)  Wt 215 lb 1.9 oz (97.578 kg)  SpO2 90% Wt Readings from Last 3 Encounters:  07/19/13 215 lb 1.9 oz (97.578 kg)  02/08/13 214 lb 9.6 oz (97.342 kg)   08/21/12 219 lb 6.4 oz (99.519 kg)   Constitutional: She is overweight; appears well-developed and well-nourished. No distress.  HENT: clear rhinnorhea, no sinus tenderness Cardiovascular: Normal rate, regular rhythm and normal heart sounds.  No murmur heard. trace BLE edema Pulmonary/Chest: Effort normal and breath sounds diminished at bases. No respiratory distress. She has no wheezes.    Lab Results  Component Value Date   WBC 7.4 08/21/2012   HGB 9.4* 08/21/2012   HCT 29.1* 08/21/2012   PLT 244.0 08/21/2012   CHOL 214* 02/08/2013   TRIG 118.0 02/08/2013   HDL 40.10 02/08/2013   LDLDIRECT 148.3 02/08/2013   ALT 9 10/10/2011   AST 13 10/10/2011   NA 140 02/08/2013   K 4.0 02/08/2013   CL 101 02/08/2013   CREATININE 1.5* 02/08/2013   BUN 27* 02/08/2013   CO2 31 02/08/2013   TSH 2.036 10/10/2011   INR 0.9 ratio 11/23/2008   HGBA1C 12.6* 02/08/2013   MICROALBUR 16.9* 02/08/2013     Assessment & Plan:   Cough, ongoing symptoms >2 mo Hypoxia and LGF -hx PNA reviewed Uncontrolled DM with hx noncompliance  Check CXR now - ?pna, ?chf Check labs tx empiric antibiotics and antitussive  follow up in 2 weeks, sooner if worse   Also see problem list. Medications and labs reviewed today.

## 2013-07-20 NOTE — Addendum Note (Signed)
Addended by: Rene PaciLESCHBER, Avayah Raffety A on: 07/20/2013 11:29 AM   Modules accepted: Orders

## 2013-07-23 ENCOUNTER — Other Ambulatory Visit: Payer: Self-pay | Admitting: Internal Medicine

## 2013-08-04 ENCOUNTER — Encounter: Payer: Self-pay | Admitting: Internal Medicine

## 2013-08-04 ENCOUNTER — Ambulatory Visit (INDEPENDENT_AMBULATORY_CARE_PROVIDER_SITE_OTHER): Payer: Medicare Other | Admitting: Internal Medicine

## 2013-08-04 VITALS — BP 152/90 | HR 88 | Temp 98.8°F | Wt 217.0 lb

## 2013-08-04 DIAGNOSIS — I10 Essential (primary) hypertension: Secondary | ICD-10-CM

## 2013-08-04 DIAGNOSIS — R05 Cough: Secondary | ICD-10-CM

## 2013-08-04 DIAGNOSIS — E1165 Type 2 diabetes mellitus with hyperglycemia: Secondary | ICD-10-CM

## 2013-08-04 DIAGNOSIS — R059 Cough, unspecified: Secondary | ICD-10-CM

## 2013-08-04 DIAGNOSIS — E1139 Type 2 diabetes mellitus with other diabetic ophthalmic complication: Secondary | ICD-10-CM

## 2013-08-04 DIAGNOSIS — R9389 Abnormal findings on diagnostic imaging of other specified body structures: Secondary | ICD-10-CM

## 2013-08-04 DIAGNOSIS — R918 Other nonspecific abnormal finding of lung field: Secondary | ICD-10-CM

## 2013-08-04 MED ORDER — GABAPENTIN 300 MG PO CAPS: 300.0000 mg | ORAL_CAPSULE | Freq: Every day | ORAL | Status: DC

## 2013-08-04 NOTE — Progress Notes (Signed)
Subjective:    Patient ID: Judith Shepherd, female    DOB: 02/03/1946, 68 y.o.   MRN: [id number]  HPI  Here for follow up - cough  Also reviewed chronic medical issues: Patient remains noncompliant with medications as rx'd   DM2, uncontrolled; hypertension; dyslipidemia; Anemia, chronic dz -   Past Medical History  Diagnosis Date  . 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 CAD 10/2008 cath    Adenosine myoview (5/10): EF 38%, mild anterior ischemia. LHC/RHC (5/10): mean RA 3, mean PCWP 5, EF 65%. Diffuse CAD in small branch vessels, consistent with diabetes. No significant stenoses in the major vessels.   . Chronic diastolic heart failure     Probably diastolic. EF 65% on echo (myoview read as 38% but may be inaccurate). Echo (5/10) with mod-severe LVH, EF 65%, diastolic dysfunction with evidence for increased LV filling pressure, no significant AS, mild LAE.  Echo 4/13:  Severe LVH, EF 60-65%, grade 2 diast dysfxn, mild LAE, mod pericardial eff with mild R atrial chamber collapse, myocardium with speckled appearance; consider amyloid.  . Arthritis     R>L knee, declines TKR  . ANEMIA   . Diabetes mellitus, type 2     uncontrolled, med noncompliance  . DYSLIPIDEMIA   . HYPERTENSION   . CARPAL TUNNEL SYNDROME, BILATERAL     Review of Systems  Constitutional: Positive for fatigue. Negative for fever.  Respiratory: Negative for cough and shortness of breath.   Cardiovascular: Negative for palpitations and leg swelling.  Genitourinary: Negative for urgency.      Objective:   Physical Exam BP 152/90  Pulse 88  Temp(Src) 98.8 F (37.1 C) (Oral)  Wt 217 lb (98.431 kg)  SpO2 90% Wt Readings from Last 3 Encounters:  08/04/13 217 lb (98.431 kg)  07/19/13 215 lb 1.9 oz (97.578 kg)  02/08/13 214 lb 9.6 oz (97.342 kg)   Constitutional: She is overweight; appears well-developed and well-nourished. No distress.  Cardiovascular: Normal rate, regular rhythm and normal heart  sounds.  No murmur heard. trace BLE edema Pulmonary/Chest: Effort normal and breath sounds diminished at bases. No respiratory distress. She has no wheezes.    Lab Results  Component Value Date   WBC 9.3 07/19/2013   HGB 10.1* 07/19/2013   HCT 31.4* 07/19/2013   PLT 264.0 07/19/2013   CHOL 214* 02/08/2013   TRIG 118.0 02/08/2013   HDL 40.10 02/08/2013   LDLDIRECT 148.3 02/08/2013   ALT 9 10/10/2011   AST 13 10/10/2011   NA 141 07/19/2013   K 3.6 07/19/2013   CL 103 07/19/2013   CREATININE 1.7* 07/19/2013   BUN 23 07/19/2013   CO2 31 07/19/2013   TSH 2.036 10/10/2011   INR 0.9 ratio 11/23/2008   HGBA1C 13.6* 07/19/2013   MICROALBUR 16.9* 02/08/2013   Dg Chest 2 View  07/19/2013   CLINICAL DATA:  Cough  EXAM: CHEST  2 VIEW  COMPARISON:  10/12/2011  FINDINGS: Marked cardiac enlargement. Vascular congestion is present with small pleural effusions suggesting fluid overload. Negative for edema.  Left lower lobe consolidation may represent atelectasis related to cardiac enlargement.  IMPRESSION: Findings consistent with mild fluid overload and left lower lobe atelectasis.   Electronically Signed   By: Marlan Palauharles  Clark M.D.   On: 07/19/2013 11:32    Assessment & Plan:   Cough, improved Abn CXR - follow up same in 2 weeks to ensure clearing Uncontrolled DM with hx noncompliance   Also see problem list. Medications and labs reviewed  today.

## 2013-08-04 NOTE — Patient Instructions (Addendum)
It was good to see you today.  We have reviewed your prior records including labs and tests today  Repeat chest xray in 2 weeks (08/18/13) - no visit with me needed. Your results will be released to MyChart (or called to you) after review, usually within 72hours after test completion. If any changes need to be made, you will be notified at that same time.  Medications reviewed and updated  Followup in 3 months for diabetes mellitus and blood pressure check, please call sooner if worse   Cough, Adult  A cough is a reflex that helps clear your throat and airways. It can help heal the body or may be a reaction to an irritated airway. A cough may only last 2 or 3 weeks (acute) or may last more than 8 weeks (chronic).  CAUSES Acute cough:  Viral or bacterial infections. Chronic cough:  Infections.  Allergies.  Asthma.  Post-nasal drip.  Smoking.  Heartburn or acid reflux.  Some medicines.  Chronic lung problems (COPD).  Cancer. SYMPTOMS   Cough.  Fever.  Chest pain.  Increased breathing rate.  High-pitched whistling sound when breathing (wheezing).  Colored mucus that you cough up (sputum). TREATMENT   A bacterial cough may be treated with antibiotic medicine.  A viral cough must run its course and will not respond to antibiotics.  Your caregiver may recommend other treatments if you have a chronic cough. HOME CARE INSTRUCTIONS   Only take over-the-counter or prescription medicines for pain, discomfort, or fever as directed by your caregiver. Use cough suppressants only as directed by your caregiver.  Use a cold steam vaporizer or humidifier in your bedroom or home to help loosen secretions.  Sleep in a semi-upright position if your cough is worse at night.  Rest as needed.  Stop smoking if you smoke. SEEK IMMEDIATE MEDICAL CARE IF:   You have pus in your sputum.  Your cough starts to worsen.  You cannot control your cough with suppressants and are  losing sleep.  You begin coughing up blood.  You have difficulty breathing.  You develop pain which is getting worse or is uncontrolled with medicine.  You have a fever. MAKE SURE YOU:   Understand these instructions.  Will watch your condition.  Will get help right away if you are not doing well or get worse. Document Released: 12/14/2010 Document Revised: 09/09/2011 Document Reviewed: 12/14/2010 [facility] Patient Information 2014 [place], [place]. Diabetes and Foot Care Diabetes may cause you to have problems because of poor blood supply (circulation) to your feet and legs. This may cause the skin on your feet to become thinner, break easier, and heal more slowly. Your skin may become dry, and the skin may peel and crack. You may also have nerve damage in your legs and feet causing decreased feeling in them. You may not notice minor injuries to your feet that could lead to infections or more serious problems. Taking care of your feet is one of the most important things you can do for yourself.  HOME CARE INSTRUCTIONS  Wear shoes at all times, even in the house. Do not go barefoot. Bare feet are easily injured.  Check your feet daily for blisters, cuts, and redness. If you cannot see the bottom of your feet, use a mirror or ask someone for help.  Wash your feet with warm water (do not use hot water) and mild soap. Then pat your feet and the areas between your toes until they are completely dry. Do  not soak your feet as this can dry your skin.  Apply a moisturizing lotion or petroleum jelly (that does not contain alcohol and is unscented) to the skin on your feet and to dry, brittle toenails. Do not apply lotion between your toes.  Trim your toenails straight across. Do not dig under them or around the cuticle. File the edges of your nails with an emery board or nail file.  Do not cut corns or calluses or try to remove them with medicine.  Wear clean socks or stockings every day. Make  sure they are not too tight. Do not wear knee-high stockings since they may decrease blood flow to your legs.  Wear shoes that fit properly and have enough cushioning. To break in new shoes, wear them for just a few hours a day. This prevents you from injuring your feet. Always look in your shoes before you put them on to be sure there are no objects inside.  Do not cross your legs. This may decrease the blood flow to your feet.  If you find a minor scrape, cut, or break in the skin on your feet, keep it and the skin around it clean and dry. These areas may be cleansed with mild soap and water. Do not cleanse the area with peroxide, alcohol, or iodine.  When you remove an adhesive bandage, be sure not to damage the skin around it.  If you have a wound, look at it several times a day to make sure it is healing.  Do not use heating pads or hot water bottles. They may burn your skin. If you have lost feeling in your feet or legs, you may not know it is happening until it is too late.  Make sure your health care provider performs a complete foot exam at least annually or more often if you have foot problems. Report any cuts, sores, or bruises to your health care provider immediately. SEEK MEDICAL CARE IF:   You have an injury that is not healing.  You have cuts or breaks in the skin.  You have an ingrown nail.  You notice redness on your legs or feet.  You feel burning or tingling in your legs or feet.  You have pain or cramps in your legs and feet.  Your legs or feet are numb.  Your feet always feel cold. SEEK IMMEDIATE MEDICAL CARE IF:   There is increasing redness, swelling, or pain in or around a wound.  There is a red line that goes up your leg.  Pus is coming from a wound.  You develop a fever or as directed by your health care provider.  You notice a bad smell coming from an ulcer or wound. Document Released: 06/14/2000 Document Revised: 02/17/2013 Document Reviewed:  11/24/2012 [facility] Patient Information 2014 [place]. Diabetic Retinopathy Diabetic retinopathy is a disease of the light-sensitive membrane at the back of the eye (retina). It is a complication of diabetes and a common cause of blindness. Early detection of the disease is key to keeping your eyes healthy.  CAUSES  Diabetic retinopathy is caused by blood sugar (glucose) levels that are too high over an extended period of time. High blood sugars cause damage to the small blood vessels of the retina, allowing blood to leak through the vessel walls. This causes visual impairment and eventually blindness. RISK FACTORS  High blood pressure.  Having diabetes for a long time.  Having poorly controlled blood sugars. SIGNS AND SYMPTOMS  In  the early stages of diabetic retinopathy, there are often no symptoms. As the condition advances, symptoms may include:  Blurred vision. This is usually caused by a swelling due to abnormal blood glucose levels. The blurriness may go away when blood glucose levels return to normal.  Moving specks or dark spots (floaters) in your vision. These can be caused by a small retinal hemorrhage. A hemorrhage is bleeding from blood vessels.  Missing parts of your field of vision, such as things at the side. This can be caused by larger retinal hemorrhages.  Difficulty reading books or signs.  Double vision.  Pain in one or both eyes.  Feeling pressure in one or both eyes.  Trouble seeing straight lines. Straight lines do not look straight.  Redness of the eyes that does not go away. DIAGNOSIS  Your eye care specialist can detect changes in the blood vessels of your eye by putting drops in your eyes that enlarge (dilate) your pupils. This allows your eye care specialist to get a good look at your retina to see if there are any changes that have occurred as a result of your diabetes. You should have your eyes examined once a year. TREATMENT  Your eye care  specialist may use a special laser beam to seal the blood vessels of the retina and stop them from leaking. Early detection and treatment are important so that further damage to your eyes can be prevented. In addition, managing your blood sugars and keeping them in the target range can slow the progress of the disease. HOME CARE INSTRUCTIONS   Keep your blood pressure within your target range.  Keep your blood glucose levels within your target range.  Follow your health care provider's instructions regarding diet and other means for controlling your blood glucose levels.  Check your blood levels for glucose as recommended by your health care provider.  Keep regular appointments with your eye specialist. An eye specialist can usually see diabetic retinopathy developing long before it starts causing problems. In many cases, it can be treated to prevent complications from occurring. If you have diabetes, you should have your eyes checked at least every year. Your risk of retinopathy increases the longer you have the disease.  If you smoke, quit. Ask your health care provider for help if needed. Smoking can make retinopathy worse. SEEK MEDICAL CARE IF:   You notice gradual blurring or other changes in your vision over time.  You notice that your glasses or contact lenses do not make things look as sharp as they once did.  You have trouble reading or seeing details at a distance with either eye.  You notice a sudden change in your vision or notice that parts of your field of vision appear missing or hazy.  You suddenly see moving specks or dark spots in the field of vision of either eye.  You have sudden partial loss of vision in either eye. Document Released: 06/14/2000 Document Revised: 04/07/2013 Document Reviewed: 12/07/2012 [facility] Patient Information 2014 [place], [place]. Diabetic Retinopathy Diabetic retinopathy is a disease of the light-sensitive membrane at the back of the eye  (retina). It is a complication of diabetes and a common cause of blindness. Early detection of the disease is key to keeping your eyes healthy.  CAUSES  Diabetic retinopathy is caused by blood sugar (glucose) levels that are too high over an extended period of time. High blood sugars cause damage to the small blood vessels of the retina, allowing blood to leak  through the vessel walls. This causes visual impairment and eventually blindness. RISK FACTORS  High blood pressure.  Having diabetes for a long time.  Having poorly controlled blood sugars. SIGNS AND SYMPTOMS  In the early stages of diabetic retinopathy, there are often no symptoms. As the condition advances, symptoms may include:  Blurred vision. This is usually caused by a swelling due to abnormal blood glucose levels. The blurriness may go away when blood glucose levels return to normal.  Moving specks or dark spots (floaters) in your vision. These can be caused by a small retinal hemorrhage. A hemorrhage is bleeding from blood vessels.  Missing parts of your field of vision, such as things at the side. This can be caused by larger retinal hemorrhages.  Difficulty reading books or signs.  Double vision.  Pain in one or both eyes.  Feeling pressure in one or both eyes.  Trouble seeing straight lines. Straight lines do not look straight.  Redness of the eyes that does not go away. DIAGNOSIS  Your eye care specialist can detect changes in the blood vessels of your eye by putting drops in your eyes that enlarge (dilate) your pupils. This allows your eye care specialist to get a good look at your retina to see if there are any changes that have occurred as a result of your diabetes. You should have your eyes examined once a year. TREATMENT  Your eye care specialist may use a special laser beam to seal the blood vessels of the retina and stop them from leaking. Early detection and treatment are important so that further damage to  your eyes can be prevented. In addition, managing your blood sugars and keeping them in the target range can slow the progress of the disease. HOME CARE INSTRUCTIONS   Keep your blood pressure within your target range.  Keep your blood glucose levels within your target range.  Follow your health care provider's instructions regarding diet and other means for controlling your blood glucose levels.  Check your blood levels for glucose as recommended by your health care provider.  Keep regular appointments with your eye specialist. An eye specialist can usually see diabetic retinopathy developing long before it starts causing problems. In many cases, it can be treated to prevent complications from occurring. If you have diabetes, you should have your eyes checked at least every year. Your risk of retinopathy increases the longer you have the disease.  If you smoke, quit. Ask your health care provider for help if needed. Smoking can make retinopathy worse. SEEK MEDICAL CARE IF:   You notice gradual blurring or other changes in your vision over time.  You notice that your glasses or contact lenses do not make things look as sharp as they once did.  You have trouble reading or seeing details at a distance with either eye.  You notice a sudden change in your vision or notice that parts of your field of vision appear missing or hazy.  You suddenly see moving specks or dark spots in the field of vision of either eye.  You have sudden partial loss of vision in either eye. Document Released: 06/14/2000 Document Revised: 04/07/2013 Document Reviewed: 12/07/2012 [facility] Patient Information 2014 [place], [place].

## 2013-08-04 NOTE — Assessment & Plan Note (Signed)
Complicated by noncompliance - working intermittently with endocrine ([facility]) Working with [facility] (optho) on retinopathy tx Changed to Levemir 09/2011, no longer on oral meds again, I pleaded for med compliance and reviewed consquences of med noncompliance and uncontrolled DM -  pt with limited acceptance of information reminded exercise and diet unlikely to provided adequate medical control of DM but encouraged to comply with plans for same in addition to meds Also prescribed ACEI, ASA 81, statin - but variable compliance  Lab Results  Component Value Date   HGBA1C 13.6* 07/19/2013   Advised followup with ophthalmology as ongoing

## 2013-08-04 NOTE — Progress Notes (Signed)
Pre-visit discussion using our clinic review tool. No additional management support is needed unless otherwise documented below in the visit note.  

## 2013-08-04 NOTE — Assessment & Plan Note (Signed)
BP Readings from Last 3 Encounters:  08/04/13 152/90  07/19/13 148/90  02/08/13 160/82   The current medical regimen's success is limited by non compliance;  Reviewed importance of compliance - pt agrees to consider continue present plan and medications and requested participation in pt's prescribed regimen

## 2013-08-06 ENCOUNTER — Telehealth: Payer: Self-pay

## 2013-08-06 NOTE — Telephone Encounter (Signed)
Relevant patient education mailed to patient.  

## 2013-09-13 LAB — HM MAMMOGRAPHY

## 2013-09-14 ENCOUNTER — Encounter: Payer: Self-pay | Admitting: Internal Medicine

## 2013-10-01 ENCOUNTER — Other Ambulatory Visit: Payer: Self-pay | Admitting: Nurse Practitioner

## 2013-10-04 ENCOUNTER — Encounter: Payer: Self-pay | Admitting: Internal Medicine

## 2013-10-12 ENCOUNTER — Other Ambulatory Visit: Payer: Self-pay | Admitting: Nurse Practitioner

## 2013-11-15 ENCOUNTER — Ambulatory Visit: Payer: Medicare Other | Admitting: Internal Medicine

## 2013-11-15 DIAGNOSIS — Z0289 Encounter for other administrative examinations: Secondary | ICD-10-CM

## 2013-12-30 ENCOUNTER — Telehealth: Payer: Self-pay | Admitting: *Deleted

## 2013-12-30 DIAGNOSIS — E1139 Type 2 diabetes mellitus with other diabetic ophthalmic complication: Secondary | ICD-10-CM

## 2013-12-30 DIAGNOSIS — E1165 Type 2 diabetes mellitus with hyperglycemia: Principal | ICD-10-CM

## 2013-12-30 NOTE — Telephone Encounter (Signed)
Patient coming in for a diabetic follow up appointment. Lipid, a1c, bmet ordered.

## 2014-01-13 ENCOUNTER — Other Ambulatory Visit: Payer: Self-pay

## 2014-01-13 MED ORDER — FUROSEMIDE 40 MG PO TABS: ORAL_TABLET | ORAL | Status: DC

## 2014-01-25 ENCOUNTER — Telehealth: Payer: Self-pay | Admitting: Internal Medicine

## 2014-01-25 MED ORDER — CEPHALEXIN 500 MG PO CAPS: 500.0000 mg | ORAL_CAPSULE | Freq: Three times a day (TID) | ORAL | Status: DC

## 2014-01-25 NOTE — Telephone Encounter (Signed)
PLEASE RETURN CALL TO PATIENT AT [phone] REGARDING RX REQUEST.  Caller: Judith Shepherd/Patient; PCP: Rene PaciLeschber, Valerie (Adults only); CB#: [phone]; Call regarding Medication Issue; Medication(s): boils under both arms ;   Patient states she developed "boils" under both arms, onset 01/21/14. Patient states she has 2 boils under each arm. States areas have become progressively larger. Patient describes as red, raised, tender areas, approx. the size of a nickel. Denies pustule or drainage. Afebrile. Patient states she has tried Peroxide, Alcohol and hot compresses without improvement.  Triage per Skin Lesions Protocol. No emergent sx identified. Disposition of " See Provider within 4 hours" obtained related to positive triage assessment for " Any skin lesion with signs and sx of worsening infection." Care advice given per guidelines. Patient declines appt. Appt. strongly recommeded, per RN. 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 Patient is requesting a Rx for an [facility] [address] at [phone].  Care advice given per guidelines. Patient advised to apply warm compresses for 20 minutes q 2-3 hours. Patient advised Tylenol 650mg . q 4 hours as needed for pain. Call back parameters reviewed. Patient verbalizes understanding.  Above documentation entered into Epic Electronic Health Record and sent to Triage Pool.  PLEASE RETURN CALL TO PATIENT AT [phone] REGARDING RX REQUEST.

## 2014-01-25 NOTE — Telephone Encounter (Signed)
Called pt @ # below no one pick up phone. Called pt at home no answer LMOM md sent antibiotic to [facility]...Raechel Chute/lmb

## 2014-01-25 NOTE — Telephone Encounter (Signed)
Reviewed In addition to symptomatic treatment as advised, ok for Keflex antibiotics 3 times a day for one week.  erx Sent to local pharmacy

## 2014-01-31 ENCOUNTER — Telehealth: Payer: Self-pay | Admitting: *Deleted

## 2014-01-31 ENCOUNTER — Telehealth: Payer: Self-pay | Admitting: Certified Registered Nurse Anesthetist

## 2014-01-31 NOTE — Telephone Encounter (Signed)
Left message to request to perform Annual Wellness Visit after visit with Leschber on 8-4.

## 2014-01-31 NOTE — Telephone Encounter (Signed)
[facility] Triage Call Report Triage Record Num: [id number] Operator: Judith Shepherd Patient Name: Judith GalasMattie Shepherd Call Date & Time: 01/25/2014 8:03:17AM Patient Phone: [phone] PCP: Judith PaciValerie Shepherd Patient Gender: Female PCP Fax : [phone] Patient DOB: 05/28/1946 Practice Name: [facility] Reason for Call: Caller: Judith Shepherd/Patient; PCP: Judith PaciLeschber, Judith (Adults only); CB#: [phone]; Call regarding Medication Issue; Medication(s): boils under both arms ; Patient states she developed "boils" under both arms, onset 01/21/14. Patient states she has 2 boils under each arm. States areas have become progressively larger. Patient describes as red, raised, tender areas, approx. the size of a nickel. Denies pustule or drainage. Afebrile. Patient states she has tried Peroxide, Alcohol and hot compresses without improvement. Triage per Skin Lesions Protocol. No emergent sx identified. Disposition of " See Provider within 4 hours" obtained related to positive triage assessment for " Any skin lesion with signs and sx of worsening infection." Care advice given per guidelines. Patient declines appt. Appt. strongly recommeded, per RN. 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 Patient is requesting a Rx for an antibiotic to be called into [facility] on [address] at [phone]. Care advice given per guidelines. Patient advised to apply warm compresses for 20 minutes q 2-3 hours. Patient advised Tylenol 650mg . q 4 hours as needed for pain. Call back parameters reviewed. Patient verbalizes understanding. PLEASE RETURN CALL TO PATIENT AT [phone] REGARDING RX REQUEST. Above documentation entered into Epic Electronic Health Record and sent to Triage Pool with high priority. Protocol(s) Used: Skin Lesions Recommended Outcome per Protocol: See Provider within 4 hours Reason for Outcome: Any skin lesion with signs and  symptoms of worsening infection (quickly getting larger, becoming more painful, purulent drainage, new fever not improving with treatment) Care Advice: ~ SYMPTOM / CONDITION MANAGEMENT Analgesic/Antipyretic Advice - Acetaminophen: Consider acetaminophen as directed on label or by pharmacist/provider for pain or fever PRECAUTIONS: - Use if there is no history of liver disease, alcoholism, or intake of three or more alcohol drinks per day - Only if approved by provider during pregnancy or when breastfeeding - During pregnancy, acetaminophen should not be taken more than 3 consecutive days without telling provider - Do not exceed recommended dose or frequency ~ Apply local moist heat (such as a warm, wet wash cloth covered with plastic wrap) to the area for 15-20 minutes every 2-3 hours while awake. ~ Controlling or Preventing Skin Infections: - Carefully wash hands briskly using warm water and soap for at least 15 seconds (sing Happy Birthday) before and ~ 01/25/2014 8:23:37AM Page 1 of 2 [email or web address] [facility] Triage Call Report Patient Name: Judith GalasMattie Shepherd continuation page/s after touching affected area or you can use a 62% alcohol-based hand sanitizer. - Cover any lesion with a clean, dry bandage until healed. Dispose of old bandages in a plastic bag and throw out with regular trash. - Avoid close contact with others until wound is healed. - Do not share personal items like towels, razors or combs. - Shower after working out in a gym or after [employer] treasurer[employer] athletic practices. Avoid sharing athletic equipment or wipe down with antibacterial wipes before use. - Wash towels and bed linens in hot water and bleach. Dry them in a hot dryer. - Wash clothes after each wearing, especially athletic or gym clothes. - If your provider tells you that have a staph or MRSA skin infection, be sure to tell other providers that may be caring for you. - If  you are given an antibiotic, take ALL of  the doses, even if the infection is getting better. 07/

## 2014-02-01 ENCOUNTER — Ambulatory Visit (INDEPENDENT_AMBULATORY_CARE_PROVIDER_SITE_OTHER): Payer: Medicare Other | Admitting: Internal Medicine

## 2014-02-01 ENCOUNTER — Encounter: Payer: Self-pay | Admitting: Internal Medicine

## 2014-02-01 ENCOUNTER — Other Ambulatory Visit (INDEPENDENT_AMBULATORY_CARE_PROVIDER_SITE_OTHER): Payer: Medicare Other

## 2014-02-01 VITALS — BP 142/78 | HR 73 | Temp 98.1°F | Ht 66.0 in | Wt 208.5 lb

## 2014-02-01 DIAGNOSIS — I10 Essential (primary) hypertension: Secondary | ICD-10-CM

## 2014-02-01 DIAGNOSIS — Z0000 Encounter for general adult medical examination without abnormal findings: Secondary | ICD-10-CM

## 2014-02-01 DIAGNOSIS — E1165 Type 2 diabetes mellitus with hyperglycemia: Principal | ICD-10-CM

## 2014-02-01 DIAGNOSIS — E1139 Type 2 diabetes mellitus with other diabetic ophthalmic complication: Secondary | ICD-10-CM

## 2014-02-01 DIAGNOSIS — L0293 Carbuncle, unspecified: Secondary | ICD-10-CM

## 2014-02-01 DIAGNOSIS — L0292 Furuncle, unspecified: Secondary | ICD-10-CM

## 2014-02-01 LAB — CBC WITH DIFFERENTIAL/PLATELET
BASOS PCT: 0 % (ref 0.0–3.0)
Basophils Absolute: 0 10*3/uL (ref 0.0–0.1)
EOS PCT: 1.6 % (ref 0.0–5.0)
Eosinophils Absolute: 0.2 10*3/uL (ref 0.0–0.7)
HEMATOCRIT: 34.6 % — AB (ref 36.0–46.0)
Hemoglobin: 11.1 g/dL — ABNORMAL LOW (ref 12.0–15.0)
Lymphocytes Relative: 9.6 % — ABNORMAL LOW (ref 12.0–46.0)
Lymphs Abs: 1 10*3/uL (ref 0.7–4.0)
MCHC: 32 g/dL (ref 30.0–36.0)
MCV: 80.9 fl (ref 78.0–100.0)
MONOS PCT: 5.7 % (ref 3.0–12.0)
Monocytes Absolute: 0.6 10*3/uL (ref 0.1–1.0)
Neutro Abs: 8.9 10*3/uL — ABNORMAL HIGH (ref 1.4–7.7)
Neutrophils Relative %: 83.1 % — ABNORMAL HIGH (ref 43.0–77.0)
PLATELETS: 270 10*3/uL (ref 150.0–400.0)
RBC: 4.28 Mil/uL (ref 3.87–5.11)
RDW: 14 % (ref 11.5–15.5)
WBC: 10.7 10*3/uL — AB (ref 4.0–10.5)

## 2014-02-01 LAB — LIPID PANEL
CHOLESTEROL: 209 mg/dL — AB (ref 0–200)
HDL: 43.1 mg/dL (ref >39.00)
LDL Cholesterol: 141 mg/dL — ABNORMAL HIGH (ref 0–99)
NONHDL: 165.9
Total CHOL/HDL Ratio: 5
Triglycerides: 124 mg/dL (ref 0.0–149.0)
VLDL: 24.8 mg/dL (ref 0.0–40.0)

## 2014-02-01 LAB — BASIC METABOLIC PANEL
BUN: 20 mg/dL (ref 6–23)
CALCIUM: 9 mg/dL (ref 8.4–10.5)
CO2: 30 mEq/L (ref 19–32)
Chloride: 101 mEq/L (ref 96–112)
Creatinine, Ser: 1.6 mg/dL — ABNORMAL HIGH (ref 0.4–1.2)
GFR: 42.15 mL/min — AB (ref >60.00)
GLUCOSE: 256 mg/dL — AB (ref 70–99)
POTASSIUM: 3.9 meq/L (ref 3.5–5.1)
SODIUM: 138 meq/L (ref 135–145)

## 2014-02-01 LAB — MICROALBUMIN / CREATININE URINE RATIO
Creatinine,U: 157.1 mg/dL
MICROALB/CREAT RATIO: 61.1 mg/g — AB (ref 0.0–30.0)
Microalb, Ur: 95.9 mg/dL — ABNORMAL HIGH (ref 0.0–1.9)

## 2014-02-01 LAB — HEMOGLOBIN A1C: Hgb A1c MFr Bld: 15.1 % — ABNORMAL HIGH (ref 4.6–6.5)

## 2014-02-01 LAB — HEPATIC FUNCTION PANEL
ALT: 12 U/L (ref 0–35)
AST: 15 U/L (ref 0–37)
Albumin: 3.1 g/dL — ABNORMAL LOW (ref 3.5–5.2)
Alkaline Phosphatase: 99 U/L (ref 39–117)
BILIRUBIN DIRECT: 0.1 mg/dL (ref 0.0–0.3)
TOTAL PROTEIN: 7.2 g/dL (ref 6.0–8.3)
Total Bilirubin: 0.8 mg/dL (ref 0.2–1.2)

## 2014-02-01 LAB — TSH: TSH: 0.83 u[IU]/mL (ref 0.35–4.50)

## 2014-02-01 MED ORDER — ATORVASTATIN CALCIUM 40 MG PO TABS: 40.0000 mg | ORAL_TABLET | Freq: Every day | ORAL | Status: DC

## 2014-02-01 MED ORDER — GABAPENTIN 300 MG PO CAPS: 300.0000 mg | ORAL_CAPSULE | Freq: Every day | ORAL | Status: AC

## 2014-02-01 MED ORDER — FLUTICASONE PROPIONATE 50 MCG/ACT NA SUSP: 2.0000 | Freq: Every day | NASAL | Status: AC

## 2014-02-01 MED ORDER — CARVEDILOL 25 MG PO TABS
25.0000 mg | ORAL_TABLET | Freq: Two times a day (BID) | ORAL | Status: DC
Start: 2014-02-01 — End: 2014-10-21

## 2014-02-01 MED ORDER — LISINOPRIL 20 MG PO TABS: 20.0000 mg | ORAL_TABLET | Freq: Every day | ORAL | Status: DC

## 2014-02-01 MED ORDER — AMLODIPINE BESYLATE 10 MG PO TABS
10.0000 mg | ORAL_TABLET | Freq: Every day | ORAL | Status: AC
Start: 2014-02-01 — End: ?

## 2014-02-01 MED ORDER — POTASSIUM CHLORIDE CRYS ER 20 MEQ PO TBCR: 20.0000 meq | EXTENDED_RELEASE_TABLET | Freq: Two times a day (BID) | ORAL | Status: AC

## 2014-02-01 MED ORDER — FUROSEMIDE 40 MG PO TABS: ORAL_TABLET | ORAL | Status: DC

## 2014-02-01 MED ORDER — INSULIN DETEMIR 100 UNIT/ML ~~LOC~~ SOLN: 25.0000 [IU] | Freq: Two times a day (BID) | SUBCUTANEOUS | Status: DC

## 2014-02-01 NOTE — Assessment & Plan Note (Signed)
BP Readings from Last 3 Encounters:  02/01/14 142/78  08/04/13 152/90  07/19/13 148/90   The current medical regimen's success is limited by non compliance;  Reviewed importance of compliance - pt dismisses same continue present plan and medications with request for participation in pt's prescribed regimen

## 2014-02-01 NOTE — Progress Notes (Signed)
Subjective:    Patient ID: Judith Shepherd, female    DOB: [birth year], 68 y.o.   MRN: [id number]  HPI  Patient is here for medicare wellness  Diet: heart healthy , ?diabetic Physical activity: sedentary Depression/mood screen: negative Hearing: intact to whispered voice Visual acuity: grossly impaired, performs semi annual eye exam  ADLs: capable Fall risk: none Home safety: good Cognitive evaluation: intact to orientation, naming, recall and repetition EOL planning: adv directives, full code/ I agree  I have personally reviewed and have noted 1. The patient's medical and social history 2. Their use of alcohol, tobacco or illicit drugs 3. Their current medications and supplements 4. The patient's functional ability including ADL's, fall risks, home safety risks and hearing or visual impairment. 5. Diet and physical activities 6. Evidence for depression or mood disorders  Also reviewed chronic medical issues and interval medical events  Past Medical History  Diagnosis Date  . 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 CAD 10/2008 cath    Adenosine myoview (5/10): EF 38%, mild anterior ischemia. LHC/RHC (5/10): mean RA 3, mean PCWP 5, EF 65%. Diffuse CAD in small branch vessels, consistent with diabetes. No significant stenoses in the major vessels.   . Chronic diastolic heart failure     Probably diastolic. EF 65% on echo (myoview read as 38% but may be inaccurate). Echo (5/10) with mod-severe LVH, EF 65%, diastolic dysfunction with evidence for increased LV filling pressure, no significant AS, mild LAE.  Echo 4/13:  Severe LVH, EF 60-65%, grade 2 diast dysfxn, mild LAE, mod pericardial eff with mild R atrial chamber collapse, myocardium with speckled appearance; consider amyloid.  . Arthritis     R>L knee, declines TKR  . ANEMIA   . Diabetes mellitus, type 2     uncontrolled, med noncompliance  . DYSLIPIDEMIA   . HYPERTENSION   . CARPAL TUNNEL SYNDROME, BILATERAL     Family History  Problem Relation Age of Onset  . Heart disease Mother   . Diabetes Mother   . Diabetes Sister   . Cancer Sister   . Diabetes Sister    History  Substance Use Topics  . Smoking status: Never Smoker   . 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 Alcohol Use: No    Review of Systems  Constitutional: Negative for fatigue and unexpected weight change.  Respiratory: Negative for cough, shortness of breath and wheezing.   Cardiovascular: Negative for chest pain, palpitations and leg swelling.  Gastrointestinal: Negative for nausea, abdominal pain and diarrhea.  Skin:       "boils" b axillae   Neurological: Negative for dizziness, weakness, light-headedness and headaches.  Psychiatric/Behavioral: Negative for dysphoric mood. The patient is not nervous/anxious.   All other systems reviewed and are negative.      Objective:   Physical Exam  BP 178/100  Pulse 73  Temp(Src) 98.1 F (36.7 C) (Oral)  Ht 5\' 6"  (1.676 m)  Wt 208 lb 8 oz (94.575 kg)  BMI 33.67 kg/m2  SpO2 95% Wt Readings from Last 3 Encounters:  02/01/14 208 lb 8 oz (94.575 kg)  08/04/13 217 lb (98.431 kg)  07/19/13 215 lb 1.9 oz (97.578 kg)   Constitutional: She appears well-developed and well-nourished. No distress.  HENT: Head: Normocephalic and atraumatic. Ears: B TMs ok, no erythema or effusion; Nose: Nose normal. Mouth/Throat: Oropharynx is clear and moist. No oropharyngeal exudate.  Eyes: Conjunctivae and  EOM are normal. Pupils are equal, round, and reactive to light. No scleral icterus.  Neck: Normal range of motion. Neck supple. No JVD present. No thyromegaly present.  Cardiovascular: Normal rate, regular rhythm and normal heart sounds.  No murmur heard. No BLE edema. Pulmonary/Chest: Effort normal and breath sounds normal. No respiratory distress. She has no wheezes.  Abdominal: Soft. Bowel sounds are normal. She  exhibits no distension. There is no tenderness. no masses GU/breast: defer to gyn Musculoskeletal: Normal range of motion, no joint effusions. No gross deformities Neurological: She is alert and oriented to person, place, and time. No cranial nerve deficit. Coordination, balance, strength, speech and gait are normal.  Skin: residual nodules under B axilla 2 on left 1 on R, no active inflammation, non tender no fluctuance. remaining skin is warm and dry. No rash noted. No erythema.  Psychiatric: She has a normal mood and affect. Her behavior is normal. Judgment and thought content normal.    Lab Results  Component Value Date   WBC 9.3 07/19/2013   HGB 10.1* 07/19/2013   HCT 31.4* 07/19/2013   PLT 264.0 07/19/2013   GLUCOSE 295* 07/19/2013   CHOL 214* 02/08/2013   TRIG 118.0 02/08/2013   HDL 40.10 02/08/2013   LDLDIRECT 148.3 02/08/2013   LDLCALC 124* 09/16/2011   ALT 9 10/10/2011   AST 13 10/10/2011   NA 141 07/19/2013   K 3.6 07/19/2013   CL 103 07/19/2013   CREATININE 1.7* 07/19/2013   BUN 23 07/19/2013   CO2 31 07/19/2013   TSH 2.036 10/10/2011   INR 0.9 ratio 11/23/2008   HGBA1C 13.6* 07/19/2013   MICROALBUR 16.9* 02/08/2013    Dg Chest 2 View  07/19/2013   CLINICAL DATA:  Cough  EXAM: CHEST  2 VIEW  COMPARISON:  10/12/2011  FINDINGS: Marked cardiac enlargement. Vascular congestion is present with small pleural effusions suggesting fluid overload. Negative for edema.  Left lower lobe consolidation may represent atelectasis related to cardiac enlargement.  IMPRESSION: Findings consistent with mild fluid overload and left lower lobe atelectasis.   Electronically Signed   By: Marlan Palau M.D.   On: 07/19/2013 11:32       Assessment & Plan:   AWV/CPX/v70.0 - Today patient counseled on age appropriate routine health concerns for screening and prevention, each reviewed and up to date or declined. Immunizations reviewed and up to date or declined. Labs ordered and reviewed. Risk factors for  depression reviewed and negative. Hearing function and visual acuity are intact. ADLs screened and addressed as needed. Functional ability and level of safety reviewed and appropriate. Education, counseling and referrals performed based on assessed risks today. Patient provided with a copy of personalized plan for preventive services.  B axillary boils, ?HS - improved with Kelfex as begun 7/29 - encouraged to complete course, no need for I&D given improvement with oral abx - explained relationship to poor DM control  Problem List Items Addressed This Visit   HYPERTENSION      BP Readings from Last 3 Encounters:  02/01/14 142/78  08/04/13 152/90  07/19/13 148/90   The current medical regimen's success is limited by non compliance;  Reviewed importance of compliance - pt dismisses same continue present plan and medications with request for participation in pt's prescribed regimen    Relevant Medications      lisinopril (PRINIVIL,ZESTRIL) tablet      amLODIpine (NORVASC) tablet      atorvastatin (LIPITOR) tablet      carvedilol (COREG) tablet  furosemide (LASIX) tablet   Other Relevant Orders      Basic metabolic panel      Lipid panel      Ambulatory referral to Ophthalmology   Type II or unspecified type diabetes mellitus with ophthalmic manifestations, uncontrolled(250.52)      Complicated by noncompliance - working intermittently with endocrine ([facility]) Working with [facility] (optho) on retinopathy tx Changed to Levemir 09/2011, no longer on oral meds again, I reviewed need for med compliance and reviewed consquences of med noncompliance with uncontrolled DM -  pt with limited acceptance of information reminded exercise and diet unlikely to provided adequate medical control of DM but encouraged to comply with plans for same in addition to meds Also prescribed ACEI, ASA 81, statin - but variable compliance  Lab Results  Component Value Date   HGBA1C 13.6* 07/19/2013   Advised  followup with ophthalmology as planned    Relevant Medications      lisinopril (PRINIVIL,ZESTRIL) tablet      insulin detemir (LEVEMIR) 100 UNIT/ML injection      atorvastatin (LIPITOR) tablet   Other Relevant Orders      Hemoglobin A1c      Lipid panel      Microalbumin / creatinine urine ratio      Ambulatory referral to Ophthalmology    Other Visit Diagnoses   Routine general medical examination at a health care facility    -  Primary    Relevant Orders       Basic metabolic panel       Lipid panel       CBC with Differential       Hepatic function panel       TSH

## 2014-02-01 NOTE — Assessment & Plan Note (Signed)
Complicated by noncompliance - working intermittently with endocrine ([facility]) Working with [facility] (optho) on retinopathy tx Changed to Levemir 09/2011, no longer on oral meds again, I reviewed need for med compliance and reviewed consquences of med noncompliance with uncontrolled DM -  pt with limited acceptance of information reminded exercise and diet unlikely to provided adequate medical control of DM but encouraged to comply with plans for same in addition to meds Also prescribed ACEI, ASA 81, statin - but variable compliance  Lab Results  Component Value Date   HGBA1C 13.6* 07/19/2013   Advised followup with ophthalmology as planned

## 2014-02-01 NOTE — Progress Notes (Signed)
Pre visit review using our clinic review tool, if applicable. No additional management support is needed unless otherwise documented below in the visit note. 

## 2014-02-01 NOTE — Patient Instructions (Addendum)
It was good to see you today.  We have reviewed your prior records including labs and tests today  Health Maintenance reviewed - all recommended immunizations and age-appropriate screenings are up-to-date.  Test(s) ordered today. Your results will be released to MyChart (or called to you) after review, usually within 72hours after test completion. If any changes need to be made, you will be notified at that same time.  Medications reviewed and updated, no changes recommended at this time. Refill on medication(s) as discussed today.  we'll make referral to new eye doctor as requsted . Our office will contact you regarding appointment(s) once made.  Please schedule followup in 3 months for diabetes mellitus check and labs, call sooner if problems.  Health Maintenance Adopting a healthy lifestyle and getting preventive care can go a long way to promote health and wellness. Talk with your health care provider about what schedule of regular examinations is right for you. This is a good chance for you to check in with your provider about disease prevention and staying healthy. In between checkups, there are plenty of things you can do on your own. Experts have done a lot of research about which lifestyle changes and preventive measures are most likely to keep you healthy. Ask your health care provider for more information. WEIGHT AND DIET  Eat a healthy diet  Be sure to include plenty of vegetables, fruits, low-fat dairy products, and lean protein.  Do not eat a lot of foods high in solid fats, added sugars, or salt.  Get regular exercise. This is one of the most important things you can do for your health.  Most adults should exercise for at least 150 minutes each week. The exercise should increase your heart rate and make you sweat (moderate-intensity exercise).  Most adults should also do strengthening exercises at least twice a week. This is in addition to the moderate-intensity exercise.   Maintain a healthy weight  Body mass index (BMI) is a measurement that can be used to identify possible weight problems. It estimates body fat based on height and weight. Your health care provider can help determine your BMI and help you achieve or maintain a healthy weight.  For females 74 years of age and older:   A BMI below 18.5 is considered underweight.  A BMI of 18.5 to 24.9 is normal.  A BMI of 25 to 29.9 is considered overweight.  A BMI of 30 and above is considered obese.  Watch levels of cholesterol and blood lipids  You should start having your blood tested for lipids and cholesterol at 68 years of age, then have this test every 5 years.  You may need to have your cholesterol levels checked more often if:  Your lipid or cholesterol levels are high.  You are older than 68 years of age.  You are at high risk for heart disease.  CANCER SCREENING   Lung Cancer  Lung cancer screening is recommended for adults 26-1 years old who are at high risk for lung cancer because of a history of smoking.  A yearly low-dose CT scan of the lungs is recommended for people who:  Currently smoke.  Have quit within the past 15 years.  Have at least a 30-pack-year history of smoking. A pack year is smoking an average of one pack of cigarettes a day for 1 year.  Yearly screening should continue until it has been 15 years since you quit.  Yearly screening should stop if you develop a  health problem that would prevent you from having lung cancer treatment.  Breast Cancer  Practice breast self-awareness. This means understanding how your breasts normally appear and feel.  It also means doing regular breast self-exams. Let your health care provider know about any changes, no matter how small.  If you are in your 20s or 30s, you should have a clinical breast exam (CBE) by a health care provider every 1-3 years as part of a regular health exam.  If you are 41 or older, have a  CBE every year. Also consider having a breast X-ray (mammogram) every year.  If you have a family history of breast cancer, talk to your health care provider about genetic screening.  If you are at high risk for breast cancer, talk to your health care provider about having an MRI and a mammogram every year.  Breast cancer gene (BRCA) assessment is recommended for women who have family members with BRCA-related cancers. BRCA-related cancers include:  Breast.  Ovarian.  Tubal.  Peritoneal cancers.  Results of the assessment will determine the need for genetic counseling and BRCA1 and BRCA2 testing. Cervical Cancer Routine pelvic examinations to screen for cervical cancer are no longer recommended for nonpregnant women who are considered low risk for cancer of the pelvic organs (ovaries, uterus, and vagina) and who do not have symptoms. A pelvic examination may be necessary if you have symptoms including those associated with pelvic infections. Ask your health care provider if a screening pelvic exam is right for you.   The Pap test is the screening test for cervical cancer for women who are considered at risk.  If you had a hysterectomy for a problem that was not cancer or a condition that could lead to cancer, then you no longer need Pap tests.  If you are older than 65 years, and you have had normal Pap tests for the past 10 years, you no longer need to have Pap tests.  If you have had past treatment for cervical cancer or a condition that could lead to cancer, you need Pap tests and screening for cancer for at least 20 years after your treatment.  If you no longer get a Pap test, assess your risk factors if they change (such as having a new sexual partner). This can affect whether you should start being screened again.  Some women have medical problems that increase their chance of getting cervical cancer. If this is the case for you, your health care provider may recommend more  frequent screening and Pap tests.  The human papillomavirus (HPV) test is another test that may be used for cervical cancer screening. The HPV test looks for the virus that can cause cell changes in the cervix. The cells collected during the Pap test can be tested for HPV.  The HPV test can be used to screen women 62 years of age and older. Getting tested for HPV can extend the interval between normal Pap tests from three to five years.  An HPV test also should be used to screen women of any age who have unclear Pap test results.  After 68 years of age, women should have HPV testing as often as Pap tests.  Colorectal Cancer  This type of cancer can be detected and often prevented.  Routine colorectal cancer screening usually begins at 68 years of age and continues through 68 years of age.  Your health care provider may recommend screening at an earlier age if you have risk factors  for colon cancer.  Your health care provider may also recommend using home test kits to check for hidden blood in the stool.  A small camera at the end of a tube can be used to examine your colon directly (sigmoidoscopy or colonoscopy). This is done to check for the earliest forms of colorectal cancer.  Routine screening usually begins at age 67.  Direct examination of the colon should be repeated every 5-10 years through 68 years of age. However, you may need to be screened more often if early forms of precancerous polyps or small growths are found. Skin Cancer  Check your skin from head to toe regularly.  Tell your health care provider about any new moles or changes in moles, especially if there is a change in a mole's shape or color.  Also tell your health care provider if you have a mole that is larger than the size of a pencil eraser.  Always use sunscreen. Apply sunscreen liberally and repeatedly throughout the day.  Protect yourself by wearing long sleeves, pants, a wide-brimmed hat, and sunglasses  whenever you are outside. HEART DISEASE, DIABETES, AND HIGH BLOOD PRESSURE   Have your blood pressure checked at least every 1-2 years. High blood pressure causes heart disease and increases the risk of stroke.  If you are between 33 years and 58 years old, ask your health care provider if you should take aspirin to prevent strokes.  Have regular diabetes screenings. This involves taking a blood sample to check your fasting blood sugar level.  If you are at a normal weight and have a low risk for diabetes, have this test once every three years after 68 years of age.  If you are overweight and have a high risk for diabetes, consider being tested at a younger age or more often. PREVENTING INFECTION  Hepatitis B  If you have a higher risk for hepatitis B, you should be screened for this virus. You are considered at high risk for hepatitis B if:  You were born in a country where hepatitis B is common. Ask your health care provider which countries are considered high risk.  Your parents were born in a high-risk country, and you have not been immunized against hepatitis B (hepatitis B vaccine).  You have HIV or AIDS.  You use needles to inject street drugs.  You live with someone who has hepatitis B.  You have had sex with someone who has hepatitis B.  You get hemodialysis treatment.  You take certain medicines for conditions, including cancer, organ transplantation, and autoimmune conditions. Hepatitis C  Blood testing is recommended for:  Everyone born from [birth year] through 1965.  Anyone with known risk factors for hepatitis C. Sexually transmitted infections (STIs)  You should be screened for sexually transmitted infections (STIs) including gonorrhea and chlamydia if:  You are sexually active and are younger than 68 years of age.  You are older than 67 years of age and your health care provider tells you that you are at risk for this type of infection.  Your sexual activity  has changed since you were last screened and you are at an increased risk for chlamydia or gonorrhea. Ask your health care provider if you are at risk.  If you do not have HIV, but are at risk, it may be recommended that you take a prescription medicine daily to prevent HIV infection. This is called pre-exposure prophylaxis (PrEP). You are considered at risk if:  You are sexually active and  do not regularly use condoms or know the HIV status of your partner(s).  You take drugs by injection.  You are sexually active with a partner who has HIV. Talk with your health care provider about whether you are at high risk of being infected with HIV. If you choose to begin PrEP, you should first be tested for HIV. You should then be tested every 3 months for as long as you are taking PrEP.  PREGNANCY   If you are premenopausal and you may become pregnant, ask your health care provider about preconception counseling.  If you may become pregnant, take 400 to 800 micrograms (mcg) of folic acid every day.  If you want to prevent pregnancy, talk to your health care provider about birth control (contraception). OSTEOPOROSIS AND MENOPAUSE   Osteoporosis is a disease in which the bones lose minerals and strength with aging. This can result in serious bone fractures. Your risk for osteoporosis can be identified using a bone density scan.  If you are 101 years of age or older, or if you are at risk for osteoporosis and fractures, ask your health care provider if you should be screened.  Ask your health care provider whether you should take a calcium or vitamin D supplement to lower your risk for osteoporosis.  Menopause may have certain physical symptoms and risks.  Hormone replacement therapy may reduce some of these symptoms and risks. Talk to your health care provider about whether hormone replacement therapy is right for you.  HOME CARE INSTRUCTIONS   Schedule regular health, dental, and eye  exams.  Stay current with your immunizations.   Do not use any tobacco products including cigarettes, chewing tobacco, or electronic cigarettes.  If you are pregnant, do not drink alcohol.  If you are breastfeeding, limit how much and how often you drink alcohol.  Limit alcohol intake to no more than 1 drink per day for nonpregnant women. One drink equals 12 ounces of beer, 5 ounces of wine, or 1 ounces of hard liquor.  Do not use street drugs.  Do not share needles.  Ask your health care provider for help if you need support or information about quitting drugs.  Tell your health care provider if you often feel depressed.  Tell your health care provider if you have ever been abused or do not feel safe at home. Document Released: 12/31/2010 Document Revised: 11/01/2013 Document Reviewed: 05/19/2013 [facility] Patient Information 2015 [place], [place]. This information is not intended to replace advice given to you by your health care provider. Make sure you discuss any questions you have with your health care provider. Diabetes and Standards of Medical Care Diabetes is complicated. You may find that your diabetes team includes a dietitian, nurse, diabetes educator, eye doctor, and more. To help everyone know what is going on and to help you get the care you deserve, the following schedule of care was developed to help keep you on track. Below are the tests, exams, vaccines, medicines, education, and plans you will need. HbA1c test This test shows how well you have controlled your glucose over the past 2-3 months. It is used to see if your diabetes management plan needs to be adjusted.   It is performed at least 2 times a year if you are meeting treatment goals.  It is performed 4 times a year if therapy has changed or if you are not meeting treatment goals. Blood pressure test  This test is performed at every routine medical visit.  The goal is less than 140/90 mm Hg for most people,  but 130/80 mm Hg in some cases. Ask your health care provider about your goal. Dental exam  Follow up with the dentist regularly. Eye exam  If you are diagnosed with type 1 diabetes as a child, get an exam upon reaching the age of 82 years or older and have had diabetes for 3-5 years. Yearly eye exams are recommended after that initial eye exam.  If you are diagnosed with type 1 diabetes as an adult, get an exam within 5 years of diagnosis and then yearly.  If you are diagnosed with type 2 diabetes, get an exam as soon as possible after the diagnosis and then yearly. Foot care exam  Visual foot exams are performed at every routine medical visit. The exams check for cuts, injuries, or other problems with the feet.  A comprehensive foot exam should be done yearly. This includes visual inspection as well as assessing foot pulses and testing for loss of sensation.  Check your feet nightly for cuts, injuries, or other problems with your feet. Tell your health care provider if anything is not healing. Kidney function test (urine microalbumin)  This test is performed once a year.  Type 1 diabetes: The first test is performed 5 years after diagnosis.  Type 2 diabetes: The first test is performed at the time of diagnosis.  A serum creatinine and estimated glomerular filtration rate (eGFR) test is done once a year to assess the level of chronic kidney disease (CKD), if present. Lipid profile (cholesterol, HDL, LDL, triglycerides)  Performed every 5 years for most people.  The goal for LDL is less than 100 mg/dL. If you are at high risk, the goal is less than 70 mg/dL.  The goal for HDL is 40 mg/dL-50 mg/dL for men and 50 mg/dL-60 mg/dL for women. An HDL cholesterol of 60 mg/dL or higher gives some protection against heart disease.  The goal for triglycerides is less than 150 mg/dL. Influenza vaccine, pneumococcal vaccine, and hepatitis B vaccine  The influenza vaccine is recommended  yearly.  It is recommended that people with diabetes who are over 95 years old get the pneumonia vaccine. In some cases, two separate shots may be given. Ask your health care provider if your pneumonia vaccination is up to date.  The hepatitis B vaccine is also recommended for adults with diabetes. Diabetes self-management education  Education is recommended at diagnosis and ongoing as needed. Treatment plan  Your treatment plan is reviewed at every medical visit. Document Released: 04/14/2009 Document Revised: 11/01/2013 Document Reviewed: 11/17/2012 [facility] Patient Information 2015 [place], [place]. This information is not intended to replace advice given to you by your health care provider. Make sure you discuss any questions you have with your health care provider.

## 2014-03-02 LAB — HM DIABETES EYE EXAM

## 2014-04-06 ENCOUNTER — Telehealth: Payer: Self-pay | Admitting: Internal Medicine

## 2014-04-06 NOTE — Telephone Encounter (Signed)
No antibiotics without OV - please schedule with any provider thx

## 2014-04-06 NOTE — Telephone Encounter (Signed)
Need a prescription for antibiotic, don't remember what kind she was taking before.

## 2014-04-07 NOTE — Telephone Encounter (Signed)
MD will not fill the abx w/o office visit.

## 2014-04-13 ENCOUNTER — Telehealth: Payer: Self-pay

## 2014-04-13 NOTE — Telephone Encounter (Signed)
Pt states that she has seen Dr. Donna Bernardleaveland this year for an eye exam.  She also expressed during the call that she has seen Dr. Luciana Axeankin since seeing Dr. Donna Bernardleaveland and she still cannot seem to get anyone to make her any glasses.  Called Dr. Serita Butcherleaveland's office to request copy of eye exam report.  Also inquired about glasses.  Receptionist stated that she would look into the issue regarding the glasses.

## 2014-04-20 ENCOUNTER — Encounter: Payer: Self-pay | Admitting: Internal Medicine

## 2014-05-30 NOTE — Telephone Encounter (Signed)
Copy of eye exam received.

## 2014-06-03 ENCOUNTER — Ambulatory Visit: Payer: Medicare Other | Admitting: Internal Medicine

## 2014-06-04 ENCOUNTER — Ambulatory Visit (INDEPENDENT_AMBULATORY_CARE_PROVIDER_SITE_OTHER): Payer: Medicare Other | Admitting: Family Medicine

## 2014-06-04 ENCOUNTER — Encounter: Payer: Self-pay | Admitting: Family Medicine

## 2014-06-04 VITALS — BP 150/78 | Temp 98.4°F | Wt 211.0 lb

## 2014-06-04 DIAGNOSIS — L720 Epidermal cyst: Secondary | ICD-10-CM

## 2014-06-04 DIAGNOSIS — N3001 Acute cystitis with hematuria: Secondary | ICD-10-CM

## 2014-06-04 DIAGNOSIS — R300 Dysuria: Secondary | ICD-10-CM

## 2014-06-04 DIAGNOSIS — J0101 Acute recurrent maxillary sinusitis: Secondary | ICD-10-CM

## 2014-06-04 LAB — POCT URINALYSIS DIPSTICK
BILIRUBIN UA: NEGATIVE
GLUCOSE UA: 100
KETONES UA: NEGATIVE
Nitrite, UA: NEGATIVE
UROBILINOGEN UA: NEGATIVE
pH, UA: 5

## 2014-06-04 MED ORDER — SULFAMETHOXAZOLE-TRIMETHOPRIM 800-160 MG PO TABS: 1.0000 | ORAL_TABLET | Freq: Two times a day (BID) | ORAL | Status: DC

## 2014-06-04 NOTE — Progress Notes (Signed)
OFFICE NOTE  06/04/2014  CC:  Chief Complaint  Patient presents with  . UTI    pt c/o of sinus infection and and uti but denies burnign with urination   HPI: Patient is a 68 y.o. African-American female who is here for resp sx's and suspicion of UTI.   Lots of nasal cong/drainage, seems to be chronic but worse lately.  No cough or fevers.  No face pain or upper teeth pain.  "I can tell when I'm starting to have a UTI"--pt says she has a little different feeling when wiping after urinating and also some mild suprapubic pain.  +Urgency lately, no dysuria. + Frequency. Also several cysts in right axilla pt says she gets recurrently, have been draining lately.  Pertinent PMH:  Past medical, surgical, social, and family history reviewed and no changes are noted since last office visit.  MEDS:  Outpatient Prescriptions Prior to Visit  Medication Sig Dispense Refill  . amLODipine (NORVASC) 10 MG tablet Take 1 tablet (10 mg total) by mouth daily. 90 tablet 3  . aspirin 325 MG tablet Take 325 mg by mouth daily.    Marland Kitchen. atorvastatin (LIPITOR) 40 MG tablet Take 1 tablet (40 mg total) by mouth daily. 90 tablet 3  . carvedilol (COREG) 25 MG tablet Take 1 tablet (25 mg total) by mouth 2 (two) times daily with a meal. 180 tablet 3  . furosemide (LASIX) 40 MG tablet TAKE TWO TABLETS BY MOUTH ONCE DAILY IN THE MORNING, AND TAKE ONE TABLET BY MOUTH ONCE DAILY IN THE EVENING 135 tablet 3  . gabapentin (NEURONTIN) 300 MG capsule Take 1 capsule (300 mg total) by mouth at bedtime. 90 capsule 3  . insulin detemir (LEVEMIR) 100 UNIT/ML injection Inject 0.25 mLs (25 Units total) into the skin 2 (two) times daily. 30 mL 11  . Insulin Pen Needle 31G X 5 MM MISC 1 each by Does not apply route daily. 100 each 1  . lisinopril (PRINIVIL,ZESTRIL) 20 MG tablet Take 1 tablet (20 mg total) by mouth daily. 90 tablet 3  . potassium chloride SA (K-DUR,KLOR-CON) 20 MEQ tablet Take 1 tablet (20 mEq total) by mouth 2 (two) times  daily. 180 tablet 3  . traMADol (ULTRAM) 50 MG tablet TAKE ONE TABLET BY MOUTH EVERY 6 HOURS AS NEEDED FOR PAIN 45 tablet 0  . ferrous sulfate 325 (65 FE) MG tablet Take 1 tablet (325 mg total) by mouth 3 (three) times daily with meals. 30 tablet 11  . fluticasone (FLONASE) 50 MCG/ACT nasal spray Place 2 sprays into both nostrils daily. (Patient not taking: Reported on 06/04/2014) 16 g 2  . cephALEXin (KEFLEX) 500 MG capsule Take 1 capsule (500 mg total) by mouth 3 (three) times daily. (Patient not taking: Reported on 06/04/2014) 21 capsule 0   No facility-administered medications prior to visit.    PE: Blood pressure 150/78, temperature 98.4 F (36.9 C), weight 211 lb (95.709 kg). VS: noted--normal. Gen: alert, NAD, NONTOXIC APPEARING. HEENT: eyes without injection, drainage, or swelling.  Ears: EACs clear, TMs with normal light reflex and landmarks.  Nose: Clear rhinorrhea, with some dried, crusty exudate adherent to mildly injected mucosa.  No purulent d/c.  Mild Right paranasal sinus TTP.  No facial swelling.  Throat and mouth without focal lesion.  No pharyngial swelling, erythema, or exudate.   Neck: supple, no LAD.   LUNGS: CTA bilat, nonlabored resps.   CV: RRR, no m/r/g. ABD: mild suprapubic TTP Right axilla with 3 small draining  cysts, no erythema or tenderness. EXT: no c/c/e SKIN: no rash  LAB:  CC UA today: large blood, trace prot, trace leuk  IMPRESSION AND PLAN:  UTI Mild sinusitis Right axillary draining cysts, possibly infected.  Plan: bactrim DS bid x 7d. Warm compresses to right axilla bid.  An After Visit Summary was printed and given to the patient.  FOLLOW UP: prn

## 2014-08-19 DIAGNOSIS — Z961 Presence of intraocular lens: Secondary | ICD-10-CM | POA: Diagnosis not present

## 2014-08-19 DIAGNOSIS — E11329 Type 2 diabetes mellitus with mild nonproliferative diabetic retinopathy without macular edema: Secondary | ICD-10-CM | POA: Diagnosis not present

## 2014-08-19 DIAGNOSIS — E11351 Type 2 diabetes mellitus with proliferative diabetic retinopathy with macular edema: Secondary | ICD-10-CM | POA: Diagnosis not present

## 2014-09-19 LAB — HM MAMMOGRAPHY: HM Mammogram: NEGATIVE

## 2014-09-26 ENCOUNTER — Encounter: Payer: Self-pay | Admitting: Internal Medicine

## 2014-10-21 ENCOUNTER — Other Ambulatory Visit: Payer: Self-pay | Admitting: *Deleted

## 2014-10-21 MED ORDER — CARVEDILOL 25 MG PO TABS: 25.0000 mg | ORAL_TABLET | Freq: Two times a day (BID) | ORAL | Status: AC

## 2014-10-21 MED ORDER — LISINOPRIL 20 MG PO TABS: 20.0000 mg | ORAL_TABLET | Freq: Every day | ORAL | Status: AC

## 2014-10-21 MED ORDER — FUROSEMIDE 40 MG PO TABS: ORAL_TABLET | ORAL | Status: DC

## 2014-10-21 MED ORDER — INSULIN DETEMIR 100 UNIT/ML ~~LOC~~ SOLN: 25.0000 [IU] | Freq: Two times a day (BID) | SUBCUTANEOUS | Status: AC

## 2014-10-21 NOTE — Telephone Encounter (Signed)
A user error has taken place:wrong patient.../lmb 

## 2014-10-24 DIAGNOSIS — Z0279 Encounter for issue of other medical certificate: Secondary | ICD-10-CM

## 2014-10-28 ENCOUNTER — Telehealth: Payer: Self-pay | Admitting: Internal Medicine

## 2014-10-28 NOTE — Telephone Encounter (Signed)
Patient Name: Judith GalasMATTIE Vecchio DOB: 05/19/1946 Initial Comment Caller states she has claustrophobia, has been for 2 weeks, worse now. Nurse Assessment Nurse: Elijah Birkaldwell, RN, Lynda Date/Time (Eastern Time): 10/28/2014 1:04:04 PM Confirm and document reason for call. If symptomatic, describe symptoms. ---Caller states she has claustrophobia, has been for 2 weeks, worse now. Has lost her sight, things are different. With episodes she has trouble breathing & anxious. Episodes last for awhile. Has the patient traveled out of the country within the last 30 days? ---Not Applicable Does the patient require triage? ---Yes Related visit to physician within the last 2 weeks? ---No Does the PT have any chronic conditions? (i.e. diabetes, asthma, etc.) ---Yes List chronic conditions. ---on Lasix, diabetes, BP rx. Guidelines Guideline Title Affirmed Question Affirmed Notes Anxiety and Panic Attack [1] Difficulty breathing AND [2] persists > 10 minutes AND [3] not relieved by reassurance provided by triager Final Disposition User Go to ED Now Elijah Birkaldwell, RN, Lynda Comments Caller states her family is not in town currently & she is by herself dealing with loss of eyesight & now these claustrophobic episodes. It causes her to feel SOB & anxious. She will find a ride to the ER or call 911/EMS.

## 2014-11-04 ENCOUNTER — Telehealth: Payer: Self-pay | Admitting: *Deleted

## 2014-11-04 NOTE — Telephone Encounter (Signed)
[facility] - Client TELEPHONE ADVICE RECORD [facility] Patient Name: Judith Shepherd Gender: Female DOB: 07/08/1945 Age: 69 Y 7 M 22 D Return Phone Number: [phone] (Primary) Address: [address] City[place]State/Zip: [place] [address] Client [facility] - Client Client Site [facility] Day Physician Rene PaciLeschber, Valerie Contact Type Call Call Type Triage / Clinical Relationship To Patient Self Appointment Disposition EMR Appointment Not Necessary Info pasted into Epic Yes Return Phone Number [phone] (Primary) Chief Complaint Unclassified Symptom Initial Comment Caller states she has claustrophobia, has been for 2 weeks, worse now. PreDisposition Call Doctor Nurse Assessment Nurse: Elijah Birkaldwell, RN, Stark BrayLynda Date/Time Lamount Cohen(Eastern Time): 10/28/2014 1:04:04 PM Confirm and document reason for call. If symptomatic, describe symptoms. ---Caller states she has claustrophobia, has been for 2 weeks, worse now. Has lost her sight, things are different. With episodes she has trouble breathing & anxious. Episodes last for awhile. Has the patient traveled out of the country within the last 30 days? ---Not Applicable Does the patient require triage? ---Yes Related visit to physician within the last 2 weeks? ---No Does the PT have any chronic conditions? (i.e. diabetes, asthma, etc.) ---Yes List chronic conditions. ---on Lasix, diabetes, BP rx. Guidelines Guideline Title Affirmed Question Affirmed Notes Nurse Date/Time (Eastern Time) Anxiety and Panic Attack [1] Difficulty breathing AND [2] persists > 10 minutes AND [3] not relieved by reassurance provided by triager Elijah Birkaldwell, RN, Lynda 10/28/2014 1:07:23 PM Disp. Time Lamount Cohen(Eastern Time) Disposition Final User 10/28/2014 1:12:04 PM Go to ED Now Yes Elijah Birkaldwell, RN, Irene ShipperLynda Caller Understands: Yes PLEASE NOTE: All timestamps contained within this report are represented as  [place] Standard Time. CONFIDENTIALTY NOTICE: This fax transmission is intended only for the addressee. It contains information that is legally privileged, confidential or otherwise protected from use or disclosure. If you are not the intended recipient, you are strictly prohibited from reviewing, disclosing, copying using or disseminating any of this information or taking any action in reliance on or regarding this information. If you have received this fax in error, please notify us immediately by telephone so that we can arrange for its return to us. Phone: [phone], Toll-Free: [phone], Fax: [phone] Page: 2 of 2 Call Id: [id number] Disagree/Comply: Comply Care Advice Given Per Guideline CARE ADVICE given per Anxiety and Panic Attack (Adult) guideline. GO TO ED NOW: You need to be seen in the Emergency Department. Go to the ER at ___________ Hospital. Leave now. Drive carefully. DRIVING: Another adult should drive. BRING MEDICINES: * Please bring a list of your current medicines when you go to the Emergency Department (ER). After Care Instructions Given Call Event Type User Date / Time Description Comments User: Lily LovingsLynda, Caldwell, RN Date/Time Lamount Cohen(Eastern Time): 10/28/2014 1:14:38 PM Caller states her family is not in town currently & she is by herself dealing with loss of eyesight & now these claustrophobic episodes. It causes her to feel SOB & anxious. She will find a ride to the ER or call 911/EMS. Referrals GO TO FACILITY UNDECIDED

## 2014-11-09 ENCOUNTER — Other Ambulatory Visit (INDEPENDENT_AMBULATORY_CARE_PROVIDER_SITE_OTHER): Payer: Medicare Other

## 2014-11-09 ENCOUNTER — Telehealth: Payer: Self-pay | Admitting: Internal Medicine

## 2014-11-09 ENCOUNTER — Other Ambulatory Visit: Payer: Self-pay

## 2014-11-09 ENCOUNTER — Ambulatory Visit (INDEPENDENT_AMBULATORY_CARE_PROVIDER_SITE_OTHER): Payer: Medicare Other | Admitting: Internal Medicine

## 2014-11-09 ENCOUNTER — Encounter: Payer: Self-pay | Admitting: Internal Medicine

## 2014-11-09 ENCOUNTER — Ambulatory Visit (INDEPENDENT_AMBULATORY_CARE_PROVIDER_SITE_OTHER)
Admission: RE | Admit: 2014-11-09 | Discharge: 2014-11-09 | Disposition: A | Discharge status: Home / Self Care | Payer: Medicare Other | Source: Ambulatory Visit | Attending: Internal Medicine | Admitting: Internal Medicine

## 2014-11-09 VITALS — BP 130/86 | HR 73 | Temp 97.6°F | Wt 230.0 lb

## 2014-11-09 DIAGNOSIS — E119 Type 2 diabetes mellitus without complications: Secondary | ICD-10-CM

## 2014-11-09 DIAGNOSIS — D649 Anemia, unspecified: Secondary | ICD-10-CM

## 2014-11-09 DIAGNOSIS — I319 Disease of pericardium, unspecified: Secondary | ICD-10-CM

## 2014-11-09 DIAGNOSIS — I5032 Chronic diastolic (congestive) heart failure: Secondary | ICD-10-CM

## 2014-11-09 DIAGNOSIS — Z0000 Encounter for general adult medical examination without abnormal findings: Secondary | ICD-10-CM | POA: Diagnosis not present

## 2014-11-09 DIAGNOSIS — F418 Other specified anxiety disorders: Secondary | ICD-10-CM | POA: Diagnosis not present

## 2014-11-09 DIAGNOSIS — R0602 Shortness of breath: Secondary | ICD-10-CM | POA: Diagnosis not present

## 2014-11-09 DIAGNOSIS — I313 Pericardial effusion (noninflammatory): Secondary | ICD-10-CM

## 2014-11-09 DIAGNOSIS — I3139 Other pericardial effusion (noninflammatory): Secondary | ICD-10-CM

## 2014-11-09 DIAGNOSIS — R531 Weakness: Secondary | ICD-10-CM | POA: Insufficient documentation

## 2014-11-09 HISTORY — DX: Other specified anxiety disorders: F41.8

## 2014-11-09 LAB — BASIC METABOLIC PANEL
BUN: 25 mg/dL — ABNORMAL HIGH (ref 6–23)
CALCIUM: 8.8 mg/dL (ref 8.4–10.5)
CO2: 32 mEq/L (ref 19–32)
CREATININE: 1.8 mg/dL — AB (ref 0.40–1.20)
Chloride: 103 mEq/L (ref 96–112)
GFR: 35.91 mL/min — ABNORMAL LOW (ref >60.00)
Glucose, Bld: 380 mg/dL — ABNORMAL HIGH (ref 70–99)
POTASSIUM: 3.7 meq/L (ref 3.5–5.1)
SODIUM: 138 meq/L (ref 135–145)

## 2014-11-09 LAB — CBC WITH DIFFERENTIAL/PLATELET
BASOS ABS: 0 10*3/uL (ref 0.0–0.1)
BASOS PCT: 0.3 % (ref 0.0–3.0)
EOS ABS: 0.2 10*3/uL (ref 0.0–0.7)
Eosinophils Relative: 2 % (ref 0.0–5.0)
HCT: 33 % — ABNORMAL LOW (ref 36.0–46.0)
Hemoglobin: 10.5 g/dL — ABNORMAL LOW (ref 12.0–15.0)
Lymphocytes Relative: 6.9 % — ABNORMAL LOW (ref 12.0–46.0)
Lymphs Abs: 0.5 10*3/uL — ABNORMAL LOW (ref 0.7–4.0)
MCHC: 31.9 g/dL (ref 30.0–36.0)
MCV: 79.6 fl (ref 78.0–100.0)
MONO ABS: 0.3 10*3/uL (ref 0.1–1.0)
Monocytes Relative: 4.5 % (ref 3.0–12.0)
NEUTROS PCT: 86.3 % — AB (ref 43.0–77.0)
Neutro Abs: 6.6 10*3/uL (ref 1.4–7.7)
PLATELETS: 208 10*3/uL (ref 150.0–400.0)
RBC: 4.14 Mil/uL (ref 3.87–5.11)
RDW: 16.9 % — AB (ref 11.5–15.5)
WBC: 7.6 10*3/uL (ref 4.0–10.5)

## 2014-11-09 LAB — HEPATIC FUNCTION PANEL
ALT: 24 U/L (ref 0–35)
AST: 20 U/L (ref 0–37)
Albumin: 3 g/dL — ABNORMAL LOW (ref 3.5–5.2)
Alkaline Phosphatase: 94 U/L (ref 39–117)
BILIRUBIN DIRECT: 0.1 mg/dL (ref 0.0–0.3)
Total Bilirubin: 0.3 mg/dL (ref 0.2–1.2)
Total Protein: 6.7 g/dL (ref 6.0–8.3)

## 2014-11-09 LAB — LIPID PANEL
CHOL/HDL RATIO: 4
CHOLESTEROL: 161 mg/dL (ref 0–200)
HDL: 38.3 mg/dL — ABNORMAL LOW (ref >39.00)
LDL Cholesterol: 101 mg/dL — ABNORMAL HIGH (ref 0–99)
NonHDL: 122.7
Triglycerides: 110 mg/dL (ref 0.0–149.0)
VLDL: 22 mg/dL (ref 0.0–40.0)

## 2014-11-09 LAB — IBC PANEL
IRON: 38 ug/dL — AB (ref 42–145)
Saturation Ratios: 12.7 % — ABNORMAL LOW (ref 20.0–50.0)
Transferrin: 214 mg/dL (ref 212.0–360.0)

## 2014-11-09 LAB — HEMOGLOBIN A1C: Hgb A1c MFr Bld: 11.9 % — ABNORMAL HIGH (ref 4.6–6.5)

## 2014-11-09 LAB — TSH: TSH: 1.31 u[IU]/mL (ref 0.35–4.50)

## 2014-11-09 MED ORDER — FUROSEMIDE 40 MG PO TABS: ORAL_TABLET | ORAL | Status: AC

## 2014-11-09 MED ORDER — ESCITALOPRAM OXALATE 10 MG PO TABS: 10.0000 mg | ORAL_TABLET | Freq: Every day | ORAL | Status: DC

## 2014-11-09 MED ORDER — FUROSEMIDE 40 MG PO TABS: ORAL_TABLET | ORAL | Status: DC

## 2014-11-09 MED ORDER — ESCITALOPRAM OXALATE 10 MG PO TABS: 10.0000 mg | ORAL_TABLET | Freq: Every day | ORAL | Status: AC

## 2014-11-09 NOTE — Telephone Encounter (Signed)
Patient's meds for today were sent to incorrect pharmacy.  [facility] [address]

## 2014-11-09 NOTE — Assessment & Plan Note (Signed)
Pt lives alone, little activity, significant weakness and risk of fall today, for labs/cxr/echo as above but also HH for RN and PT

## 2014-11-09 NOTE — Telephone Encounter (Signed)
Medications were fixed.

## 2014-11-09 NOTE — Progress Notes (Signed)
Pre visit review using our clinic review tool, if applicable. No additional management support is needed unless otherwise documented below in the visit note. 

## 2014-11-09 NOTE — Assessment & Plan Note (Signed)
To start lexapro 10  qd 

## 2014-11-09 NOTE — Patient Instructions (Addendum)
[facility]  Please take all new medication as prescribed - the lexapro 10 mg per day  OK to take the lasix at 60 mg twice per day (that would 1 and 1/2 of the 40 mg pills twice per day)  Please check your weight at home in the AM every day, write it down, and bring to next appointment  Please continue all other medications as before, and refills have been done if requested.  Please have the pharmacy call with any other refills you may need.  Please keep your appointments with your specialists as you may have planned  You will be contacted regarding the referral for: Home Health with RN and home physical therapy  You will be contacted regarding the referral for: echocardiogram  Please go to the XRAY Department in the Basement (go straight as you get off the elevator) for the x-ray testing  Please go to the LAB in the Basement (turn left off the elevator) for the tests to be done today  You will be contacted by phone if any changes need to be made immediately.  Otherwise, you will receive a letter about your results with an explanation, but please check with MyChart first.  Please return in 1 week to Dr Felicity CoyerLeschber (or myself if she is not here), or sooner if needed

## 2014-11-09 NOTE — Addendum Note (Signed)
Addended by: Maurene CapesPOTTS, Anira [place] M on: 11/09/2014 03:51 PM   Modules accepted: Orders

## 2014-11-09 NOTE — Progress Notes (Signed)
Subjective:    Patient ID: Judith Shepherd, female    DOB: May 30, 1946, 69 y.o.   MRN: [id number]    HPI  Here wih 2-3 wks worsening depression and anxiety, as well as sudden worsening claustrophobia like symptoms, even in the house at night, makes her want to be outside the house, so turns on all the lights and tries to sleep in a chair, lives alone. Still able to get all ADL's adequate. Here with friend today.  Pt denies chest pain, orthopnea, PND, palpitations, dizziness or syncope, though has had recent worsening sob/doe for about 6 mo, panting today to come in the door to office per pt, though o2 sat ok.  Has hx of allergies, and mild recent possible wheezing.  Also some worsening bilat ankle edema for 1 mo, Taking lasix 40 bid (listed supposed to be taking 80 in am, 40 in pm but pt feels is too much), Overall otherwise good compliance with treatment, and good medicine tolerability.  Last echo ------------------------------------------------------------ Transthoracic Echocardiography  Patient:  Judith Shepherd MR #:    [id number] Study Date: 08/31/2012 Gender:   F Age:    26 Height:   167.6cm Weight:   98.4kg BSA:    2.28m^2 Pt. Status: Room:  REFERRING  Shirlee Latch, Dalton PERFORMING  Redge Gainer, Site 3 ATTENDING  Tyrone Sage, Marlena Clipper, Lori REFERRING  Norma Fredrickson SONOGRAPHER Junious Dresser, RDCS cc:  ------------------------------------------------------------ LV EF: 60%  ------------------------------------------------------------ Indications:   Dyspnea 786.09. Pericardial effusion 423.9.  ------------------------------------------------------------ History:  PMH: ? Amyloid. Acquired from the patient and from the patient's chart. Dyspnea and exertional dyspnea. Coronary artery disease. Congestive heart failure, with an ejection fraction of 65%by echocardiography. The dysfunction is primarily diastolic.  Pericardial effusion. Risk factors: Diabetes mellitus. Obese.  ------------------------------------------------------------ Study Conclusions  - Left ventricle: The cavity size was normal. Wall thickness was increased in a pattern of severe LVH. The estimated ejection fraction was 60%. Wall motion was normal; there were no regional wall motion abnormalities. - Right atrium: The atrium was mildly dilated. - Pulmonary arteries: PA peak pressure: 35mm Hg (S). - Inferior vena cava: The vessel was normal in size; the respirophasic diameter changes were in the normal range (= 50%); findings are consistent with normal central venous pressure. - Pericardium, extracardiac: Moderately large circumferential pericardial effusion. No hemodynamic effect. - Impressions: There is no change from 11/2011 study (including the pericardial effusion).  Past Medical History  Diagnosis Date  . 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 CAD 10/2008 cath    Adenosine myoview (5/10): EF 38%, mild anterior ischemia. LHC/RHC (5/10): mean RA 3, mean PCWP 5, EF 65%. Diffuse CAD in small branch vessels, consistent with diabetes. No significant stenoses in the major vessels.   . Chronic diastolic heart failure     Probably diastolic. EF 65% on echo (myoview read as 38% but may be inaccurate). Echo (5/10) with mod-severe LVH, EF 65%, diastolic dysfunction with evidence for increased LV filling pressure, no significant AS, mild LAE.  Echo 4/13:  Severe LVH, EF 60-65%, grade 2 diast dysfxn, mild LAE, mod pericardial eff with mild R atrial chamber collapse, myocardium with speckled appearance; consider amyloid.  . Arthritis     R>L knee, declines TKR  . ANEMIA   . Diabetes mellitus, type 2     uncontrolled, med noncompliance  . DYSLIPIDEMIA   . HYPERTENSION   . CARPAL TUNNEL SYNDROME, BILATERAL    Past Surgical History  Procedure Laterality Date  .  Abdominal hysterectomy      reports that  she has never smoked. She has never used smokeless tobacco. She reports that she does not drink alcohol or use illicit drugs. family history includes Cancer in her sister; Diabetes in her mother, sister, and sister; Heart disease in her mother. No Known Allergies Current Outpatient Prescriptions on File Prior to Visit  Medication Sig Dispense Refill  . amLODipine (NORVASC) 10 MG tablet Take 1 tablet (10 mg total) by mouth daily. 90 tablet 3  . aspirin 325 MG tablet Take 325 mg by mouth daily.    Marland Kitchen. atorvastatin (LIPITOR) 40 MG tablet Take 1 tablet (40 mg total) by mouth daily. 90 tablet 3  . carvedilol (COREG) 25 MG tablet Take 1 tablet (25 mg total) by mouth 2 (two) times daily with a meal. 180 tablet 2  . ferrous sulfate 325 (65 FE) MG tablet Take 1 tablet (325 mg total) by mouth 3 (three) times daily with meals. 30 tablet 11  . fluticasone (FLONASE) 50 MCG/ACT nasal spray Place 2 sprays into both nostrils daily. (Patient not taking: Reported on 06/04/2014) 16 g 2  . furosemide (LASIX) 40 MG tablet TAKE TWO TABLETS BY MOUTH ONCE DAILY IN THE MORNING, AND TAKE ONE TABLET BY MOUTH ONCE DAILY IN THE EVENING 135 tablet 2  . gabapentin (NEURONTIN) 300 MG capsule Take 1 capsule (300 mg total) by mouth at bedtime. 90 capsule 3  . insulin detemir (LEVEMIR) 100 UNIT/ML injection Inject 0.25 mLs (25 Units total) into the skin 2 (two) times daily. 90 mL 1  . Insulin Pen Needle 31G X 5 MM MISC 1 each by Does not apply route daily. 100 each 1  . lisinopril (PRINIVIL,ZESTRIL) 20 MG tablet Take 1 tablet (20 mg total) by mouth daily. 90 tablet 1  . potassium chloride SA (K-DUR,KLOR-CON) 20 MEQ tablet Take 1 tablet (20 mEq total) by mouth 2 (two) times daily. 180 tablet 3  . sulfamethoxazole-trimethoprim (BACTRIM DS,SEPTRA DS) 800-160 MG per tablet Take 1 tablet by mouth 2 (two) times daily. 14 tablet 0  . traMADol (ULTRAM) 50 MG tablet TAKE ONE TABLET BY MOUTH EVERY 6 HOURS AS NEEDED FOR PAIN 45 tablet 0   No  current facility-administered medications on file prior to visit.   Review of Systems  Constitutional: Negative for unusual diaphoresis or night sweats HENT: Negative for ringing in ear or discharge Eyes: Negative for double vision or worsening visual disturbance.  Respiratory: Negative for choking and stridor.   Gastrointestinal: Negative for vomiting or other signifcant bowel change Genitourinary: Negative for hematuria or change in urine volume.  Musculoskeletal: Negative for other MSK pain or swelling Skin: Negative for color change and worsening wound.  Neurological: Negative for tremors and numbness other than noted  Psychiatric/Behavioral: Negative for decreased concentration or agitation other than above       Objective:   Physical Exam BP 130/86 mmHg  Pulse 73  Temp(Src) 97.6 F (36.4 C) (Oral)  Wt 230 lb (104.327 kg)  SpO2 93% VS noted,  Constitutional: Pt appears in no significant distress, except very deconditioned, needs a lot of assist to get up on exam table HENT: Head: NCAT.  Right Ear: External ear normal.  Left Ear: External ear normal.  Eyes: . Pupils are equal, round, and reactive to light. Conjunctivae and EOM are normal Neck: Normal range of motion. Neck supple.  Cardiovascular: Normal rate and regular rhythm.  , unable to apprec rub Pulmonary/Chest: Effort normal and breath sounds with  few bibas rales, no wheezing.  Abd:  Soft, NT, ND, + BS Neurological: Pt is alert. Not confused , motor grossly intact Skin: Skin is warm. No rash, LE edema 1-2+ to knees Psychiatric: Pt behavior is normal. No agitation. + depressed affect, mild nervous     Assessment & Plan:

## 2014-11-09 NOTE — Assessment & Plan Note (Addendum)
Also for f/u echo due to sob/doe, ECG reviewed as per emr

## 2014-11-09 NOTE — Telephone Encounter (Signed)
Medications were called into the wrong pharmacy. Medications have been sent to the correct pharmacy now.

## 2014-11-09 NOTE — Assessment & Plan Note (Addendum)
With worsening edema, few bibas rales and sob/doe - for incr lasix 60 bid, cont k as is for now, daily wts if possible at home, f/u 1 wk with PCP  Note:  Total time for pt hx, exam, review of record with pt in the room, determination of diagnoses and plan for further eval and tx is > 40 min, with over 50% spent in coordination and counseling of patient

## 2014-11-09 NOTE — Assessment & Plan Note (Signed)
Control unclear, last a1c aug 2015,  Lab Results  Component Value Date   HGBA1C 15.1* 02/01/2014   For f/u labs

## 2014-11-09 NOTE — Assessment & Plan Note (Signed)
No overt bleeding, ? Related to CKD, for bun/cr and cbc Lab Results  Component Value Date   WBC 10.7* 02/01/2014   HGB 11.1* 02/01/2014   HCT 34.6* 02/01/2014   PLT 270.0 02/01/2014   GLUCOSE 256* 02/01/2014   CHOL 209* 02/01/2014   TRIG 124.0 02/01/2014   HDL 43.10 02/01/2014   LDLDIRECT 148.3 02/08/2013   LDLCALC 141* 02/01/2014   ALT 12 02/01/2014   AST 15 02/01/2014   NA 138 02/01/2014   K 3.9 02/01/2014   CL 101 02/01/2014   CREATININE 1.6* 02/01/2014   BUN 20 02/01/2014   CO2 30 02/01/2014   TSH 0.83 02/01/2014   INR 0.9 ratio 11/23/2008   HGBA1C 15.1* 02/01/2014   MICROALBUR 95.9 Verified by manual dilution.* 02/01/2014

## 2014-11-13 ENCOUNTER — Encounter: Payer: Self-pay | Admitting: Internal Medicine

## 2014-11-13 ENCOUNTER — Other Ambulatory Visit: Payer: Self-pay | Admitting: Internal Medicine

## 2014-11-13 DIAGNOSIS — E1121 Type 2 diabetes mellitus with diabetic nephropathy: Secondary | ICD-10-CM

## 2014-11-15 ENCOUNTER — Encounter: Payer: Self-pay | Admitting: Endocrinology

## 2014-11-17 ENCOUNTER — Telehealth: Payer: Self-pay | Admitting: Internal Medicine

## 2014-11-17 NOTE — Telephone Encounter (Signed)
Heather from [facility] went to patients home for home health care today and she did not answer door or answer telephone. She just wanted you to be aware

## 2014-11-17 NOTE — Telephone Encounter (Signed)
Received order for patient to be seen but needs to know why patient needs to be seen and when care needs to start.

## 2014-11-18 NOTE — Telephone Encounter (Signed)
Verbal okay given to go to patient on Monday and Tuesday next week.

## 2014-11-18 NOTE — Telephone Encounter (Signed)
Herbert SetaHeather called again today and patient declined to be seen today because she has "too much too do" and wanted her to come back on Monday. She is concerned that if she really is homebound. She will need verbal authorization for her visit on Monday

## 2014-11-19 NOTE — Telephone Encounter (Signed)
Concerns noted Agree with VO as provided thanks

## 2014-11-21 ENCOUNTER — Telehealth: Payer: Self-pay | Admitting: Internal Medicine

## 2014-11-21 DIAGNOSIS — I5032 Chronic diastolic (congestive) heart failure: Secondary | ICD-10-CM

## 2014-11-21 DIAGNOSIS — M25569 Pain in unspecified knee: Secondary | ICD-10-CM

## 2014-11-21 NOTE — Telephone Encounter (Signed)
Herbert SetaHeather from [facility] [phone]   They did not admitted for her home care,  Pt would like out pt therapy and congestive heart fall clinic to help mange

## 2014-11-22 NOTE — Telephone Encounter (Signed)
Referral for outpatient physical therapy and CHF clinic done

## 2014-12-02 ENCOUNTER — Telehealth: Payer: Self-pay | Admitting: *Deleted

## 2014-12-02 DIAGNOSIS — I5032 Chronic diastolic (congestive) heart failure: Secondary | ICD-10-CM

## 2014-12-02 NOTE — Telephone Encounter (Signed)
Or der that was put in 11/09/14 Echocardiogram was not put in correctly. Per [place] not showing up on orders tab. Re-entered order...Raechel Chute/lmb

## 2014-12-12 ENCOUNTER — Other Ambulatory Visit (HOSPITAL_COMMUNITY): Payer: Medicare Other

## 2014-12-13 ENCOUNTER — Encounter (HOSPITAL_COMMUNITY): Payer: Self-pay | Admitting: Internal Medicine

## 2014-12-28 ENCOUNTER — Other Ambulatory Visit: Payer: Self-pay

## 2014-12-28 MED ORDER — ATORVASTATIN CALCIUM 40 MG PO TABS: 40.0000 mg | ORAL_TABLET | Freq: Every day | ORAL | Status: AC

## 2015-01-04 ENCOUNTER — Encounter (HOSPITAL_COMMUNITY): Payer: Self-pay | Admitting: *Deleted

## 2015-01-04 ENCOUNTER — Inpatient Hospital Stay (HOSPITAL_COMMUNITY)
Admission: EM | Admit: 2015-01-04 | Discharge: 2015-01-30 | DRG: 291 | Disposition: E | Payer: Medicare Other | Attending: Internal Medicine | Admitting: Internal Medicine

## 2015-01-04 ENCOUNTER — Emergency Department (HOSPITAL_COMMUNITY): Payer: Medicare Other

## 2015-01-04 DIAGNOSIS — N189 Chronic kidney disease, unspecified: Secondary | ICD-10-CM | POA: Diagnosis not present

## 2015-01-04 DIAGNOSIS — I313 Pericardial effusion (noninflammatory): Secondary | ICD-10-CM | POA: Diagnosis present

## 2015-01-04 DIAGNOSIS — I130 Hypertensive heart and chronic kidney disease with heart failure and stage 1 through stage 4 chronic kidney disease, or unspecified chronic kidney disease: Secondary | ICD-10-CM | POA: Diagnosis not present

## 2015-01-04 DIAGNOSIS — Z79899 Other long term (current) drug therapy: Secondary | ICD-10-CM | POA: Diagnosis not present

## 2015-01-04 DIAGNOSIS — I43 Cardiomyopathy in diseases classified elsewhere: Secondary | ICD-10-CM | POA: Diagnosis present

## 2015-01-04 DIAGNOSIS — N179 Acute kidney failure, unspecified: Secondary | ICD-10-CM | POA: Diagnosis present

## 2015-01-04 DIAGNOSIS — J9601 Acute respiratory failure with hypoxia: Secondary | ICD-10-CM | POA: Diagnosis not present

## 2015-01-04 DIAGNOSIS — I509 Heart failure, unspecified: Secondary | ICD-10-CM | POA: Diagnosis not present

## 2015-01-04 DIAGNOSIS — N183 Chronic kidney disease, stage 3 unspecified: Secondary | ICD-10-CM | POA: Diagnosis present

## 2015-01-04 DIAGNOSIS — I161 Hypertensive emergency: Secondary | ICD-10-CM | POA: Insufficient documentation

## 2015-01-04 DIAGNOSIS — Y92009 Unspecified place in unspecified non-institutional (private) residence as the place of occurrence of the external cause: Secondary | ICD-10-CM

## 2015-01-04 DIAGNOSIS — W19XXXA Unspecified fall, initial encounter: Secondary | ICD-10-CM | POA: Diagnosis present

## 2015-01-04 DIAGNOSIS — R32 Unspecified urinary incontinence: Secondary | ICD-10-CM | POA: Diagnosis present

## 2015-01-04 DIAGNOSIS — Z8249 Family history of ischemic heart disease and other diseases of the circulatory system: Secondary | ICD-10-CM

## 2015-01-04 DIAGNOSIS — G629 Polyneuropathy, unspecified: Secondary | ICD-10-CM | POA: Diagnosis not present

## 2015-01-04 DIAGNOSIS — I10 Essential (primary) hypertension: Secondary | ICD-10-CM | POA: Diagnosis present

## 2015-01-04 DIAGNOSIS — F418 Other specified anxiety disorders: Secondary | ICD-10-CM | POA: Diagnosis not present

## 2015-01-04 DIAGNOSIS — E785 Hyperlipidemia, unspecified: Secondary | ICD-10-CM | POA: Diagnosis present

## 2015-01-04 DIAGNOSIS — E1165 Type 2 diabetes mellitus with hyperglycemia: Secondary | ICD-10-CM | POA: Diagnosis present

## 2015-01-04 DIAGNOSIS — E11359 Type 2 diabetes mellitus with proliferative diabetic retinopathy without macular edema: Secondary | ICD-10-CM | POA: Diagnosis present

## 2015-01-04 DIAGNOSIS — I5033 Acute on chronic diastolic (congestive) heart failure: Secondary | ICD-10-CM | POA: Diagnosis not present

## 2015-01-04 DIAGNOSIS — J9690 Respiratory failure, unspecified, unspecified whether with hypoxia or hypercapnia: Secondary | ICD-10-CM

## 2015-01-04 DIAGNOSIS — D631 Anemia in chronic kidney disease: Secondary | ICD-10-CM | POA: Diagnosis not present

## 2015-01-04 DIAGNOSIS — I119 Hypertensive heart disease without heart failure: Secondary | ICD-10-CM | POA: Diagnosis present

## 2015-01-04 DIAGNOSIS — E1140 Type 2 diabetes mellitus with diabetic neuropathy, unspecified: Secondary | ICD-10-CM | POA: Diagnosis present

## 2015-01-04 DIAGNOSIS — I2510 Atherosclerotic heart disease of native coronary artery without angina pectoris: Secondary | ICD-10-CM | POA: Diagnosis present

## 2015-01-04 DIAGNOSIS — Z6836 Body mass index (BMI) 36.0-36.9, adult: Secondary | ICD-10-CM | POA: Diagnosis not present

## 2015-01-04 DIAGNOSIS — E118 Type 2 diabetes mellitus with unspecified complications: Secondary | ICD-10-CM | POA: Diagnosis present

## 2015-01-04 DIAGNOSIS — I5043 Acute on chronic combined systolic (congestive) and diastolic (congestive) heart failure: Secondary | ICD-10-CM | POA: Diagnosis not present

## 2015-01-04 DIAGNOSIS — H548 Legal blindness, as defined in USA: Secondary | ICD-10-CM | POA: Diagnosis not present

## 2015-01-04 DIAGNOSIS — Z7982 Long term (current) use of aspirin: Secondary | ICD-10-CM | POA: Diagnosis not present

## 2015-01-04 DIAGNOSIS — I3139 Other pericardial effusion (noninflammatory): Secondary | ICD-10-CM | POA: Diagnosis present

## 2015-01-04 DIAGNOSIS — I4901 Ventricular fibrillation: Secondary | ICD-10-CM | POA: Diagnosis not present

## 2015-01-04 DIAGNOSIS — I5021 Acute systolic (congestive) heart failure: Secondary | ICD-10-CM | POA: Diagnosis not present

## 2015-01-04 DIAGNOSIS — R778 Other specified abnormalities of plasma proteins: Secondary | ICD-10-CM | POA: Diagnosis present

## 2015-01-04 DIAGNOSIS — E1122 Type 2 diabetes mellitus with diabetic chronic kidney disease: Secondary | ICD-10-CM | POA: Diagnosis present

## 2015-01-04 DIAGNOSIS — E669 Obesity, unspecified: Secondary | ICD-10-CM | POA: Diagnosis present

## 2015-01-04 DIAGNOSIS — Z833 Family history of diabetes mellitus: Secondary | ICD-10-CM | POA: Diagnosis not present

## 2015-01-04 DIAGNOSIS — E854 Organ-limited amyloidosis: Secondary | ICD-10-CM | POA: Diagnosis present

## 2015-01-04 DIAGNOSIS — Z794 Long term (current) use of insulin: Secondary | ICD-10-CM | POA: Diagnosis not present

## 2015-01-04 DIAGNOSIS — E08351 Diabetes mellitus due to underlying condition with proliferative diabetic retinopathy with macular edema: Secondary | ICD-10-CM

## 2015-01-04 DIAGNOSIS — I248 Other forms of acute ischemic heart disease: Secondary | ICD-10-CM | POA: Diagnosis not present

## 2015-01-04 DIAGNOSIS — E113599 Type 2 diabetes mellitus with proliferative diabetic retinopathy without macular edema, unspecified eye: Secondary | ICD-10-CM | POA: Diagnosis present

## 2015-01-04 DIAGNOSIS — I469 Cardiac arrest, cause unspecified: Secondary | ICD-10-CM | POA: Diagnosis not present

## 2015-01-04 DIAGNOSIS — D649 Anemia, unspecified: Secondary | ICD-10-CM | POA: Diagnosis present

## 2015-01-04 DIAGNOSIS — R030 Elevated blood-pressure reading, without diagnosis of hypertension: Secondary | ICD-10-CM | POA: Diagnosis not present

## 2015-01-04 DIAGNOSIS — I5032 Chronic diastolic (congestive) heart failure: Secondary | ICD-10-CM | POA: Diagnosis present

## 2015-01-04 DIAGNOSIS — R7989 Other specified abnormal findings of blood chemistry: Secondary | ICD-10-CM | POA: Diagnosis present

## 2015-01-04 DIAGNOSIS — G931 Anoxic brain damage, not elsewhere classified: Secondary | ICD-10-CM | POA: Diagnosis not present

## 2015-01-04 DIAGNOSIS — R0602 Shortness of breath: Secondary | ICD-10-CM | POA: Diagnosis not present

## 2015-01-04 LAB — URINALYSIS, ROUTINE W REFLEX MICROSCOPIC
Bilirubin Urine: NEGATIVE
Glucose, UA: 500 mg/dL — AB
Ketones, ur: NEGATIVE mg/dL
Leukocytes, UA: NEGATIVE
Nitrite: NEGATIVE
Protein, ur: 100 mg/dL — AB
Specific Gravity, Urine: 1.01 (ref 1.005–1.030)
Urobilinogen, UA: 0.2 mg/dL (ref 0.0–1.0)
pH: 7 (ref 5.0–8.0)

## 2015-01-04 LAB — CBC WITH DIFFERENTIAL/PLATELET
Basophils Absolute: 0 K/uL (ref 0.0–0.1)
Basophils Relative: 0 % (ref 0–1)
Eosinophils Absolute: 0.1 K/uL (ref 0.0–0.7)
Eosinophils Relative: 1 % (ref 0–5)
HCT: 33.5 % — ABNORMAL LOW (ref 36.0–46.0)
Hemoglobin: 10 g/dL — ABNORMAL LOW (ref 12.0–15.0)
Lymphocytes Relative: 8 % — ABNORMAL LOW (ref 12–46)
Lymphs Abs: 0.8 K/uL (ref 0.7–4.0)
MCH: 24.9 pg — ABNORMAL LOW (ref 26.0–34.0)
MCHC: 29.9 g/dL — ABNORMAL LOW (ref 30.0–36.0)
MCV: 83.3 fL (ref 78.0–100.0)
Monocytes Absolute: 0.5 K/uL (ref 0.1–1.0)
Monocytes Relative: 5 % (ref 3–12)
Neutro Abs: 8.1 K/uL — ABNORMAL HIGH (ref 1.7–7.7)
Neutrophils Relative %: 86 % — ABNORMAL HIGH (ref 43–77)
Platelets: 283 K/uL (ref 150–400)
RBC: 4.02 MIL/uL (ref 3.87–5.11)
RDW: 15.5 % (ref 11.5–15.5)
WBC: 9.5 K/uL (ref 4.0–10.5)

## 2015-01-04 LAB — TROPONIN I
Troponin I: 0.6 ng/mL
Troponin I: 0.53 ng/mL (ref <0.031)

## 2015-01-04 LAB — CBG MONITORING, ED
Glucose-Capillary: 279 mg/dL — ABNORMAL HIGH (ref 65–99)
Glucose-Capillary: 208 mg/dL — ABNORMAL HIGH (ref 65–99)

## 2015-01-04 LAB — BASIC METABOLIC PANEL WITH GFR
Anion gap: 8 (ref 5–15)
BUN: 33 mg/dL — ABNORMAL HIGH (ref 6–20)
CO2: 28 mmol/L (ref 22–32)
Calcium: 9.1 mg/dL (ref 8.9–10.3)
Chloride: 104 mmol/L (ref 101–111)
Creatinine, Ser: 1.92 mg/dL — ABNORMAL HIGH (ref 0.44–1.00)
GFR calc Af Amer: 30 mL/min — ABNORMAL LOW
GFR calc non Af Amer: 26 mL/min — ABNORMAL LOW
Glucose, Bld: 288 mg/dL — ABNORMAL HIGH (ref 65–99)
Potassium: 4.4 mmol/L (ref 3.5–5.1)
Sodium: 140 mmol/L (ref 135–145)

## 2015-01-04 LAB — MRSA PCR SCREENING: MRSA by PCR: NEGATIVE

## 2015-01-04 LAB — URINE MICROSCOPIC-ADD ON

## 2015-01-04 LAB — BRAIN NATRIURETIC PEPTIDE: B Natriuretic Peptide: 2568 pg/mL — ABNORMAL HIGH (ref 0.0–100.0)

## 2015-01-04 MED ORDER — HYDRALAZINE HCL 20 MG/ML IJ SOLN: 10.0000 mg | Freq: Once | INTRAMUSCULAR | Status: DC

## 2015-01-04 MED ORDER — FUROSEMIDE 10 MG/ML IJ SOLN
80.0000 mg | Freq: Once | INTRAMUSCULAR | Status: AC
  Administered 2015-01-04: 80 mg via INTRAVENOUS
  Filled 2015-01-04: qty 8

## 2015-01-04 MED ORDER — INSULIN DETEMIR 100 UNIT/ML ~~LOC~~ SOLN
25.0000 [IU] | Freq: Two times a day (BID) | SUBCUTANEOUS | Status: DC
  Administered 2015-01-04 – 2015-01-05 (×2): 25 [IU] via SUBCUTANEOUS
  Filled 2015-01-04 (×4): qty 0.25

## 2015-01-04 MED ORDER — FUROSEMIDE 10 MG/ML IJ SOLN
60.0000 mg | Freq: Two times a day (BID) | INTRAMUSCULAR | Status: DC
  Administered 2015-01-05 (×2): 60 mg via INTRAVENOUS
  Filled 2015-01-04 (×2): qty 6

## 2015-01-04 MED ORDER — ACETAMINOPHEN 650 MG RE SUPP: 650.0000 mg | Freq: Four times a day (QID) | RECTAL | Status: DC | PRN

## 2015-01-04 MED ORDER — SODIUM CHLORIDE 0.9 % IJ SOLN
3.0000 mL | Freq: Two times a day (BID) | INTRAMUSCULAR | Status: DC
  Administered 2015-01-05: 3 mL via INTRAVENOUS

## 2015-01-04 MED ORDER — ASPIRIN 325 MG PO TABS: 325.0000 mg | ORAL_TABLET | Freq: Every day | ORAL | Status: DC

## 2015-01-04 MED ORDER — ATORVASTATIN CALCIUM 40 MG PO TABS
40.0000 mg | ORAL_TABLET | Freq: Every day | ORAL | Status: DC
  Administered 2015-01-04 – 2015-01-05 (×2): 40 mg via ORAL
  Filled 2015-01-04 (×2): qty 1

## 2015-01-04 MED ORDER — CLONIDINE HCL 0.1 MG PO TABS
0.2000 mg | ORAL_TABLET | Freq: Once | ORAL | Status: DC
  Filled 2015-01-04: qty 2

## 2015-01-04 MED ORDER — HYDRALAZINE HCL 20 MG/ML IJ SOLN
10.0000 mg | INTRAMUSCULAR | Status: DC | PRN
Start: 2015-01-04 — End: 2015-01-06
  Administered 2015-01-05: 10 mg via INTRAVENOUS
  Filled 2015-01-04 (×2): qty 1

## 2015-01-04 MED ORDER — ONDANSETRON HCL 4 MG/2ML IJ SOLN: 4.0000 mg | Freq: Four times a day (QID) | INTRAMUSCULAR | Status: DC | PRN

## 2015-01-04 MED ORDER — ONDANSETRON HCL 4 MG PO TABS: 4.0000 mg | ORAL_TABLET | Freq: Four times a day (QID) | ORAL | Status: DC | PRN

## 2015-01-04 MED ORDER — CETYLPYRIDINIUM CHLORIDE 0.05 % MT LIQD
7.0000 mL | Freq: Two times a day (BID) | OROMUCOSAL | Status: DC
  Administered 2015-01-05: 7 mL via OROMUCOSAL

## 2015-01-04 MED ORDER — CARVEDILOL 12.5 MG PO TABS
25.0000 mg | ORAL_TABLET | Freq: Two times a day (BID) | ORAL | Status: DC
  Filled 2015-01-04: qty 2
  Filled 2015-01-04: qty 1

## 2015-01-04 MED ORDER — ACETAMINOPHEN 325 MG PO TABS
650.0000 mg | ORAL_TABLET | Freq: Four times a day (QID) | ORAL | Status: DC | PRN
  Administered 2015-01-05 (×2): 650 mg via ORAL
  Filled 2015-01-04 (×2): qty 2

## 2015-01-04 MED ORDER — ASPIRIN 81 MG PO CHEW
324.0000 mg | CHEWABLE_TABLET | Freq: Once | ORAL | Status: AC
  Administered 2015-01-04: 324 mg via ORAL
  Filled 2015-01-04: qty 4

## 2015-01-04 MED ORDER — NITROGLYCERIN IN D5W 200-5 MCG/ML-% IV SOLN
0.0000 ug/min | INTRAVENOUS | Status: DC
  Administered 2015-01-04: 5 ug/min via INTRAVENOUS
  Administered 2015-01-05: 170 ug/min via INTRAVENOUS
  Administered 2015-01-05: 155 ug/min via INTRAVENOUS
  Administered 2015-01-05: 175 ug/min via INTRAVENOUS
  Administered 2015-01-05: 160 ug/min via INTRAVENOUS
  Administered 2015-01-05: 150 ug/min via INTRAVENOUS
  Administered 2015-01-05: 200 ug/min via INTRAVENOUS
  Administered 2015-01-05: 120 ug/min via INTRAVENOUS
  Administered 2015-01-05: 165 ug/min via INTRAVENOUS
  Administered 2015-01-05: 145 ug/min via INTRAVENOUS
  Administered 2015-01-05: 135 ug/min via INTRAVENOUS
  Administered 2015-01-05: 140 ug/min via INTRAVENOUS
  Administered 2015-01-05: 115 ug/min via INTRAVENOUS
  Filled 2015-01-04 (×4): qty 250

## 2015-01-04 NOTE — Progress Notes (Signed)
Utilization Review completed.  Makoto Sellitto RN CM  

## 2015-01-04 NOTE — ED Provider Notes (Signed)
CSN: [id number]     Arrival date & time 01/25/2015  1441 History   First MD Initiated Contact with Patient 01/08/2015 1541     Chief Complaint  Patient presents with  . Fall  . Hypertension  . Hyperglycemia     (Consider location/radiation/quality/duration/timing/severity/associated sxs/prior Treatment) HPI  69 year old female presents after a fall and inability to get up off the floor. The patient has multiple chronic medical problems such as hypertension, CAD, diastolic heart failure, and diabetes. The patient states that she takes her medicines daily but took her medicines later today. The son at the bedside states the patient self medicates based on how she is feeling and is very erratic in taking her medicines. The patient denies any current symptoms such shortness of breath but the son notes that she was more short of breath today and having trouble walking. She has had increased leg swelling over the last couple weeks. Saw her PCP one week ago and increased her Lasix to 60 mg twice a day. She has been doing this. After the sliding out of the chair today EMS was called because the son cannot get off before. They noted her to be hyperglycemic and hypertensive and so she came to the ER for evaluation. She has chronic lower extremity weakness and uses a cane at baseline but this was more weak than normal.  Past Medical History  Diagnosis Date  . 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 CAD 10/2008 cath    Adenosine myoview (5/10): EF 38%, mild anterior ischemia. LHC/RHC (5/10): mean RA 3, mean PCWP 5, EF 65%. Diffuse CAD in small branch vessels, consistent with diabetes. No significant stenoses in the major vessels.   . Chronic diastolic heart failure     Probably diastolic. EF 65% on echo (myoview read as 38% but may be inaccurate). Echo (5/10) with mod-severe LVH, EF 65%, diastolic dysfunction with evidence for increased LV filling pressure, no significant AS, mild LAE.  Echo 4/13:  Severe  LVH, EF 60-65%, grade 2 diast dysfxn, mild LAE, mod pericardial eff with mild R atrial chamber collapse, myocardium with speckled appearance; consider amyloid.  . Arthritis     R>L knee, declines TKR  . ANEMIA   . Diabetes mellitus, type 2     uncontrolled, med noncompliance  . DYSLIPIDEMIA   . HYPERTENSION   . CARPAL TUNNEL SYNDROME, BILATERAL   . Depression with anxiety 11/09/2014   Past Surgical History  Procedure Laterality Date  . Abdominal hysterectomy     Family History  Problem Relation Age of Onset  . Heart disease Mother   . Diabetes Mother   . Diabetes Sister   . Cancer Sister   . Diabetes Sister    History  Substance Use Topics  . Smoking status: Never Smoker   . Smokeless tobacco: Never Used     Comment: retired from city of [place], International aid/development workershuttle driver; lives alone, divorced  . Alcohol Use: No   OB History    No data available     Review of Systems  Constitutional: Negative for fever.  Respiratory: Positive for shortness of breath.   Cardiovascular: Positive for leg swelling. Negative for chest pain.  Gastrointestinal: Negative for vomiting and abdominal pain.  Genitourinary: Positive for frequency.  Neurological: Positive for weakness. Negative for headaches.  All other systems reviewed and are negative.     Allergies  Review of patient's allergies indicates no known allergies.  Home Medications   Prior to Admission medications   Medication  Sig Start Date End Date Taking? Authorizing Provider  aspirin 325 MG tablet Take 325 mg by mouth daily.   Yes Historical Provider, MD  atorvastatin (LIPITOR) 40 MG tablet Take 1 tablet (40 mg total) by mouth daily. 12/28/14  Yes Newt Lukes, MD  carvedilol (COREG) 25 MG tablet Take 1 tablet (25 mg total) by mouth 2 (two) times daily with a meal. 10/21/14  Yes Newt Lukes, MD  furosemide (LASIX) 40 MG tablet Take 1 and 1/2 tablets twice per day Patient taking differently: Take 40 mg by mouth daily. Take 1  and 1/2 tablets twice per day 11/09/14  Yes Corwin Levins, MD  insulin detemir (LEVEMIR) 100 UNIT/ML injection Inject 0.25 mLs (25 Units total) into the skin 2 (two) times daily. 10/21/14 10/21/15 Yes Newt Lukes, MD  Insulin Pen Needle 31G X 5 MM MISC 1 each by Does not apply route daily. 05/12/13  Yes Newt Lukes, MD  lisinopril (PRINIVIL,ZESTRIL) 20 MG tablet Take 1 tablet (20 mg total) by mouth daily. 10/21/14  Yes Newt Lukes, MD  amLODipine (NORVASC) 10 MG tablet Take 1 tablet (10 mg total) by mouth daily. Patient not taking: Reported on 01/11/2015 02/01/14   Newt Lukes, MD  escitalopram (LEXAPRO) 10 MG tablet Take 1 tablet (10 mg total) by mouth daily. Patient not taking: Reported on 01/06/2015 11/09/14 02/07/15  Corwin Levins, MD  ferrous sulfate 325 (65 FE) MG tablet Take 1 tablet (325 mg total) by mouth 3 (three) times daily with meals. Patient not taking: Reported on 01/24/2015 10/12/11 01/06/2015  Vassie Loll, MD  fluticasone [facility]) 50 MCG/ACT nasal spray Place 2 sprays into both nostrils daily. Patient not taking: Reported on 06/04/2014 02/01/14   Newt Lukes, MD  gabapentin (NEURONTIN) 300 MG capsule Take 1 capsule (300 mg total) by mouth at bedtime. Patient not taking: Reported on 12/30/2014 02/01/14 08/24/15  Newt Lukes, MD  potassium chloride SA (K-DUR,KLOR-CON) 20 MEQ tablet Take 1 tablet (20 mEq total) by mouth 2 (two) times daily. Patient not taking: Reported on 01/16/2015 02/01/14   Newt Lukes, MD   BP 203/78 mmHg  Pulse 81  Temp(Src) 98.6 F (37 C) (Oral)  Resp 18  SpO2 94% Physical Exam  Constitutional: She is oriented to person, place, and time. She appears well-developed and well-nourished. No distress.  HENT:  Head: Normocephalic and atraumatic.  Right Ear: External ear normal.  Left Ear: External ear normal.  Nose: Nose normal.  Eyes: Right eye exhibits no discharge. Left eye exhibits no discharge.  Cardiovascular: Normal rate, regular  rhythm and normal heart sounds.   Pulmonary/Chest: Effort normal. No accessory muscle usage. No tachypnea. No respiratory distress. She has rales (bilateral rales).  Abdominal: Soft. There is no tenderness.  Musculoskeletal: She exhibits edema (bilateral pitting edema to lower extremities).  Neurological: She is alert and oriented to person, place, and time.  Skin: Skin is warm and dry. She is not diaphoretic.  Nursing note and vitals reviewed.   ED Course  Procedures (including critical care time) Labs Review Labs Reviewed  BASIC METABOLIC PANEL - Abnormal; Notable for the following:    Glucose, Bld 288 (*)    BUN 33 (*)    Creatinine, Ser 1.92 (*)    GFR calc non Af Amer 26 (*)    GFR calc Af Amer 30 (*)    All other components within normal limits  TROPONIN I - Abnormal; Notable for the following:  Troponin I 0.60 (*)    All other components within normal limits  CBC WITH DIFFERENTIAL/PLATELET - Abnormal; Notable for the following:    Hemoglobin 10.0 (*)    HCT 33.5 (*)    MCH 24.9 (*)    MCHC 29.9 (*)    Neutrophils Relative % 86 (*)    Neutro Abs 8.1 (*)    Lymphocytes Relative 8 (*)    All other components within normal limits  BRAIN NATRIURETIC PEPTIDE - Abnormal; Notable for the following:    B Natriuretic Peptide 2568.0 (*)    All other components within normal limits  URINALYSIS, ROUTINE W REFLEX MICROSCOPIC (NOT AT [facility]) - Abnormal; Notable for the following:    Glucose, UA 500 (*)    Hgb urine dipstick SMALL (*)    Protein, ur 100 (*)    All other components within normal limits  TROPONIN I - Abnormal; Notable for the following:    Troponin I 0.53 (*)    All other components within normal limits  CBG MONITORING, ED - Abnormal; Notable for the following:    Glucose-Capillary 279 (*)    All other components within normal limits  CBG MONITORING, ED - Abnormal; Notable for the following:    Glucose-Capillary 208 (*)    All other components within normal  limits  MRSA PCR SCREENING  URINE MICROSCOPIC-ADD ON  COMPREHENSIVE METABOLIC PANEL  CBC  TROPONIN I  TROPONIN I    Imaging Review Dg Chest 2 View  Jan 14, 2015   CLINICAL DATA:  Hyperglycemia.  Weakness and shortness of breath.  EXAM: CHEST  2 VIEW  COMPARISON:  11/09/2014  FINDINGS: Marked cardiac enlargement and pulmonary vascular congestion is noted. No pleural effusion or edema. No airspace consolidation.  IMPRESSION: 1. Cardiac enlargement. 2. Pulmonary vascular congestion.   Electronically Signed   By: Signa Kell M.D.   On: January 14, 2015 16:54     EKG Interpretation None      ED ECG REPORT   Date: 14-Jan-2015  Rate: 74  Rhythm: normal sinus rhythm  QRS Axis: normal  Intervals: normal  ST/T Wave abnormalities: nonspecific T wave changes  Conduction Disutrbances:none  Narrative Interpretation: T wave changes less prominent compared to 2013  Old EKG Reviewed: changes noted  I have personally reviewed the EKG tracing and agree with the computerized printout as noted.  MDM   Final diagnoses:  CHF exacerbation    Patient appears to have pulmonary edema and uncontrolled HTN. Does have small troponin elevation but is currently asymptomatic. D/w cards, Dr. Patty Sermons, who recommends HTN management and cards consult but admit to medicine. Patient has slowly downtrended on HTN while in ED. D/w Dr. Rhona Leavens who requests IV hydralazine and may need nitro drip if doesn't improve. Will need stepdown admission.    Pricilla Loveless, MD 01/13/2015 [phone]

## 2015-01-04 NOTE — ED Notes (Addendum)
Daughter Derrel Nip(Nicole Zieg[place], [place]) [phone] provided some hx information Visually impaired, IBS, Cellulitis, HTN, DM, CHF. Daughter concerned that patient will need assistance after returning home. She request pt is XXX, pass code to be [place] because she feels patient needs to rest

## 2015-01-04 NOTE — ED Notes (Signed)
Bed: WA09 Expected date:  Expected time:  Means of arrival:  Comments: EMS- fall, hypertension, hyperglycemia

## 2015-01-04 NOTE — ED Notes (Signed)
EMS contacted by patient d/t fall, No LOC. Pt hypertensive as well hyperglycemic on arrival. Prior to leaving home patient took lisinopril, carvedilol and levemir. She also takes Lasix daily 60 mg as a result frequent urination. Patient has hx of blindness

## 2015-01-04 NOTE — H&P (Addendum)
[facility] History and Physical  ARNOLD DEPINTO XBJ:[id number] DOB: [birth year] DOA: 01/08/2015  Referring physician: Emergency Department PCP: Rene Paci, MD  Specialists:   Chief Complaint: Fall, HTN, elevated glucose  HPI: Judith Shepherd is a 69 y.o. female  Who presents to the ED after several falls and elevated BP. In the ED, pt was found to have sbp well into the 200's with glucose just under 200 (of note most recent a1c of over 11 in 5/16. The patient was found to have vascular congestion on CXR with BNP of 2,568. Pt was given  IV lasix and  IV hydralazine. Cardiology was initially called through ED who recommended medical admission. Hospitalist consulted for admission.  Review of Systems:  Review of Systems  Constitutional: Negative for chills and malaise/fatigue.  HENT: Negative for ear pain and tinnitus.   Respiratory: Positive for shortness of breath. Negative for wheezing.   Cardiovascular: Positive for orthopnea and leg swelling.  Gastrointestinal: Negative for nausea and abdominal pain.  Genitourinary: Negative for urgency and hematuria.  Musculoskeletal: Positive for falls. Negative for joint pain and neck pain.  Neurological: Negative for tremors, seizures and loss of consciousness.  Psychiatric/Behavioral: Negative for substance abuse. The patient is not nervous/anxious.      Past Medical History  Diagnosis Date  . 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 CAD 10/2008 cath    Adenosine myoview (5/10): EF 38%, mild anterior ischemia. LHC/RHC (5/10): mean RA 3, mean PCWP 5, EF 65%. Diffuse CAD in small branch vessels, consistent with diabetes. No significant stenoses in the major vessels.   . Chronic diastolic heart failure     Probably diastolic. EF 65% on echo (myoview read as 38% but may be inaccurate). Echo (5/10) with mod-severe LVH, EF 65%, diastolic dysfunction with evidence for increased LV filling pressure, no significant AS, mild  LAE.  Echo 4/13:  Severe LVH, EF 60-65%, grade 2 diast dysfxn, mild LAE, mod pericardial eff with mild R atrial chamber collapse, myocardium with speckled appearance; consider amyloid.  . Arthritis     R>L knee, declines TKR  . ANEMIA   . Diabetes mellitus, type 2     uncontrolled, med noncompliance  . DYSLIPIDEMIA   . HYPERTENSION   . CARPAL TUNNEL SYNDROME, BILATERAL   . Depression with anxiety 11/09/2014   Past Surgical History  Procedure Laterality Date  . Abdominal hysterectomy     Social History:  reports that she has never smoked. She has never used smokeless tobacco. She reports that she does not drink alcohol or use illicit drugs.  where does patient live--home, ALF, SNF? and with whom if at home?  Can patient participate in ADLs?  No Known Allergies  Family History  Problem Relation Age of Onset  . Heart disease Mother   . Diabetes Mother   . Diabetes Sister   . Cancer Sister   . Diabetes Sister     (be sure to complete)  Prior to Admission medications   Medication Sig Start Date End Date Taking? Authorizing Provider  aspirin 325 MG tablet Take 325 mg by mouth daily.   Yes Historical Provider, MD  atorvastatin (LIPITOR) 40 MG tablet Take 1 tablet (40 mg total) by mouth daily. 12/28/14  Yes Newt Lukes, MD  carvedilol (COREG) 25 MG tablet Take 1 tablet (25 mg total) by mouth 2 (two) times daily with a meal. 10/21/14  Yes Newt Lukes, MD  furosemide (LASIX) 40 MG tablet Take 1 and 1/2 tablets twice  per day Patient taking differently: Take 40 mg by mouth daily. Take 1 and 1/2 tablets twice per day 11/09/14  Yes Corwin LevinsJames W John, MD  insulin detemir (LEVEMIR) 100 UNIT/ML injection Inject 0.25 mLs (25 Units total) into the skin 2 (two) times daily. 10/21/14 10/21/15 Yes Newt LukesValerie A Leschber, MD  Insulin Pen Needle 31G X 5 MM MISC 1 each by Does not apply route daily. 05/12/13  Yes Newt LukesValerie A Leschber, MD  lisinopril (PRINIVIL,ZESTRIL) 20 MG tablet Take 1 tablet (20 mg  total) by mouth daily. 10/21/14  Yes Newt LukesValerie A Leschber, MD  amLODipine (NORVASC) 10 MG tablet Take 1 tablet (10 mg total) by mouth daily. Patient not taking: Reported on 01/20/2015 02/01/14   Newt LukesValerie A Leschber, MD  escitalopram (LEXAPRO) 10 MG tablet Take 1 tablet (10 mg total) by mouth daily. Patient not taking: Reported on 01/14/2015 11/09/14 02/07/15  Corwin LevinsJames W John, MD  ferrous sulfate 325 (65 FE) MG tablet Take 1 tablet (325 mg total) by mouth 3 (three) times daily with meals. Patient not taking: Reported on 01/28/2015 10/12/11 01/18/2015  Vassie Lollarlos Madera, MD  fluticasone [facility](FLONASE) 50 MCG/ACT nasal spray Place 2 sprays into both nostrils daily. Patient not taking: Reported on 06/04/2014 02/01/14   Newt LukesValerie A Leschber, MD  gabapentin (NEURONTIN) 300 MG capsule Take 1 capsule (300 mg total) by mouth at bedtime. Patient not taking: Reported on 01/26/2015 02/01/14 08/24/15  Newt LukesValerie A Leschber, MD  potassium chloride SA (K-DUR,KLOR-CON) 20 MEQ tablet Take 1 tablet (20 mEq total) by mouth 2 (two) times daily. Patient not taking: Reported on 01/21/2015 02/01/14   Newt LukesValerie A Leschber, MD   Physical Exam: Filed Vitals:   01/19/2015 1448 01/03/2015 1451 01/15/2015 1634 01/09/2015 1800  BP:  203/78 267/110 203/84  Pulse:  81 81 64  Temp:  98.6 F (37 C)    TempSrc:  Oral    Resp:  18 16 10   SpO2: 96% 94% 98% 100%     General:  Awake, in nad  Eyes: PERRL B  ENT: membranes moist, dentition fair  Neck: trachea midline, neck supple  Cardiovascular: regular, s1, s2  Respiratory: normal resp effort, no wheezing  Abdomen: soft,nondistended  Skin: normal skin turgor, no abnormal skin lesions seen  Musculoskeletal: perfused, no clubbing  Psychiatric: mood/affect normal // no auditory/visual hallucinatinos  Neurologic: cn2-12 grossly intact, strength/sensation intact  Labs on Admission:  Basic Metabolic Panel:  Recent Labs Lab 01/02/2015 1631  NA 140  K 4.4  CL 104  CO2 28  GLUCOSE 288*  BUN 33*  CREATININE 1.92*   CALCIUM 9.1   Liver Function Tests: No results for input(s): AST, ALT, ALKPHOS, BILITOT, PROT, ALBUMIN in the last 168 hours. No results for input(s): LIPASE, AMYLASE in the last 168 hours. No results for input(s): AMMONIA in the last 168 hours. CBC:  Recent Labs Lab 01/02/2015 1631  WBC 9.5  NEUTROABS 8.1*  HGB 10.0*  HCT 33.5*  MCV 83.3  PLT 283   Cardiac Enzymes:  Recent Labs Lab 01/11/2015 1631  TROPONINI 0.60*    BNP (last 3 results)  Recent Labs  01/06/2015 1631  BNP 2568.0*    ProBNP (last 3 results) No results for input(s): PROBNP in the last 8760 hours.  CBG:  Recent Labs Lab 01/12/2015 1450  GLUCAP 279*    Radiological Exams on Admission: Dg Chest 2 View  01/23/2015   CLINICAL DATA:  Hyperglycemia.  Weakness and shortness of breath.  EXAM: CHEST  2 VIEW  COMPARISON:  11/09/2014  FINDINGS: Marked cardiac enlargement and pulmonary vascular congestion is noted. No pleural effusion or edema. No airspace consolidation.  IMPRESSION: 1. Cardiac enlargement. 2. Pulmonary vascular congestion.   Electronically Signed   By: Signa Kell M.D.   On: 01/03/2015 16:54     Assessment/Plan Principal Problem:   Accelerated hypertension Active Problems:   Diabetes   Proliferative diabetic retinopathy   CHF exacerbation   Hypertensive emergency   Acute systolic CHF (congestive heart failure)   DM2  1. Accelerated HTN 1. SBP remains in the mid-200's 2. Question compliance with medications prior to admit given legal blindness 3. Will start on nitroglycerin gtt 4. Will hold ACEI secondary to cr of 1.9 5. Admit to stepdown 2. ARF 1. Cr today of 1.9 (baseline 1.5-1.6) 2. Pt is clinically volume overloaded with LE edema and pulm vascular congestion, thus will cont on lasix per below 3. Falls 1. Suspect secondary to hypertensive crisis 2. Consult PT/OT 4. DM2 1. Will cont with SSI coverage 2. Cont home meds 3. Diabetic diet 5. Diabetic retinopathy 1. Pt  legally blind 6. Acute CHF exacerbation, unclear if systolic vs diastolic 1. Cont on bid IV lasix at 60mg  2. Follow daily weight and I/O's 3. Follow renal function and lytes closely 4. Last 2d echo in 2014 with EF of 60%. Will repeat 2d echo 7. DVT prophylaxis 1. Cont SCD's 8. Dispo planning 1. Pt lives by self 2. Concerns for unsafe living situation 3. Pt/OT per above  Code Status: Full Family Communication: Pt in room, family at bedside Disposition Plan: Admit stepdown    Jerald Kief [facility] Pager [phone]  If 7PM-7AM, please contact night-coverage [email or web address] Password [facility] 01/20/2015, 6:26 PM

## 2015-01-04 NOTE — ED Notes (Signed)
Ocygen assessed on RA, decreased 87%, 2 liters O2 applied, saturation increased 97%

## 2015-01-04 NOTE — ED Notes (Signed)
Troponin 0.60 Called by Lamar SprinklesBill RN and MD aware

## 2015-01-04 NOTE — ED Notes (Signed)
O2 reassesed without O2, sat decreased 85-88%, 2 lt reapplied

## 2015-01-04 NOTE — ED Notes (Signed)
Unable to give report at this time. CN request report be given to next shift RN

## 2015-01-04 NOTE — ED Notes (Signed)
celluilitis in upper right thigh area posterior

## 2015-01-05 ENCOUNTER — Inpatient Hospital Stay (HOSPITAL_COMMUNITY): Payer: Medicare Other

## 2015-01-05 ENCOUNTER — Inpatient Hospital Stay (HOSPITAL_COMMUNITY): Payer: Medicare Other | Admitting: Certified Registered"

## 2015-01-05 DIAGNOSIS — E669 Obesity, unspecified: Secondary | ICD-10-CM | POA: Diagnosis present

## 2015-01-05 DIAGNOSIS — I509 Heart failure, unspecified: Secondary | ICD-10-CM

## 2015-01-05 DIAGNOSIS — I110 Hypertensive heart disease with heart failure: Secondary | ICD-10-CM

## 2015-01-05 DIAGNOSIS — I319 Disease of pericardium, unspecified: Secondary | ICD-10-CM

## 2015-01-05 DIAGNOSIS — I10 Essential (primary) hypertension: Secondary | ICD-10-CM

## 2015-01-05 DIAGNOSIS — R7989 Other specified abnormal findings of blood chemistry: Secondary | ICD-10-CM

## 2015-01-05 DIAGNOSIS — N189 Chronic kidney disease, unspecified: Secondary | ICD-10-CM

## 2015-01-05 DIAGNOSIS — I5033 Acute on chronic diastolic (congestive) heart failure: Secondary | ICD-10-CM

## 2015-01-05 DIAGNOSIS — R778 Other specified abnormalities of plasma proteins: Secondary | ICD-10-CM | POA: Diagnosis present

## 2015-01-05 LAB — CBC
HCT: 27.5 % — ABNORMAL LOW (ref 36.0–46.0)
Hemoglobin: 8.4 g/dL — ABNORMAL LOW (ref 12.0–15.0)
MCH: 25.5 pg — ABNORMAL LOW (ref 26.0–34.0)
MCHC: 30.5 g/dL (ref 30.0–36.0)
MCV: 83.6 fL (ref 78.0–100.0)
Platelets: 231 10*3/uL (ref 150–400)
RBC: 3.29 MIL/uL — ABNORMAL LOW (ref 3.87–5.11)
RDW: 15.6 % — AB (ref 11.5–15.5)
WBC: 6.9 10*3/uL (ref 4.0–10.5)

## 2015-01-05 LAB — COMPREHENSIVE METABOLIC PANEL
ALBUMIN: 2.6 g/dL — AB (ref 3.5–5.0)
ALT: 18 U/L (ref 14–54)
ANION GAP: 3 — AB (ref 5–15)
AST: 17 U/L (ref 15–41)
Alkaline Phosphatase: 63 U/L (ref 38–126)
BUN: 33 mg/dL — AB (ref 6–20)
CO2: 31 mmol/L (ref 22–32)
CREATININE: 1.94 mg/dL — AB (ref 0.44–1.00)
Calcium: 8.3 mg/dL — ABNORMAL LOW (ref 8.9–10.3)
Chloride: 106 mmol/L (ref 101–111)
GFR calc Af Amer: 29 mL/min — ABNORMAL LOW (ref >60)
GFR calc non Af Amer: 25 mL/min — ABNORMAL LOW (ref >60)
Glucose, Bld: 115 mg/dL — ABNORMAL HIGH (ref 65–99)
POTASSIUM: 3.5 mmol/L (ref 3.5–5.1)
Sodium: 140 mmol/L (ref 135–145)
TOTAL PROTEIN: 6.2 g/dL — AB (ref 6.5–8.1)
Total Bilirubin: 0.5 mg/dL (ref 0.3–1.2)

## 2015-01-05 LAB — TROPONIN I
Troponin I: 0.47 ng/mL — ABNORMAL HIGH (ref <0.031)
Troponin I: 0.48 ng/mL — ABNORMAL HIGH (ref <0.031)

## 2015-01-05 LAB — GLUCOSE, CAPILLARY: Glucose-Capillary: 146 mg/dL — ABNORMAL HIGH (ref 65–99)

## 2015-01-05 LAB — MAGNESIUM: Magnesium: 2.1 mg/dL (ref 1.7–2.4)

## 2015-01-05 MED ORDER — IPRATROPIUM-ALBUTEROL 0.5-2.5 (3) MG/3ML IN SOLN: 3.0000 mL | Freq: Four times a day (QID) | RESPIRATORY_TRACT | Status: DC | PRN

## 2015-01-05 MED ORDER — INSULIN ASPART 100 UNIT/ML ~~LOC~~ SOLN: 0.0000 [IU] | Freq: Every day | SUBCUTANEOUS | Status: DC

## 2015-01-05 MED ORDER — FAMOTIDINE IN NACL 20-0.9 MG/50ML-% IV SOLN: 20.0000 mg | Freq: Two times a day (BID) | INTRAVENOUS | Status: DC

## 2015-01-05 MED ORDER — CARVEDILOL 6.25 MG PO TABS
6.2500 mg | ORAL_TABLET | Freq: Two times a day (BID) | ORAL | Status: DC
  Administered 2015-01-05: 6.25 mg via ORAL
  Filled 2015-01-05: qty 1

## 2015-01-05 MED ORDER — MIDAZOLAM HCL 2 MG/2ML IJ SOLN: 1.0000 mg | INTRAMUSCULAR | Status: DC | PRN

## 2015-01-05 MED ORDER — CLONIDINE HCL 0.1 MG PO TABS
0.1000 mg | ORAL_TABLET | Freq: Two times a day (BID) | ORAL | Status: DC
  Administered 2015-01-05: 0.1 mg via ORAL
  Filled 2015-01-05: qty 1

## 2015-01-05 MED ORDER — INSULIN ASPART 100 UNIT/ML ~~LOC~~ SOLN
0.0000 [IU] | Freq: Three times a day (TID) | SUBCUTANEOUS | Status: DC
  Administered 2015-01-05: 2 [IU] via SUBCUTANEOUS

## 2015-01-05 MED ORDER — CETYLPYRIDINIUM CHLORIDE 0.05 % MT LIQD: 7.0000 mL | Freq: Four times a day (QID) | OROMUCOSAL | Status: DC

## 2015-01-05 MED ORDER — NOREPINEPHRINE BITARTRATE 1 MG/ML IV SOLN
2.0000 ug/min | INTRAVENOUS | Status: DC
  Filled 2015-01-05: qty 4

## 2015-01-05 MED ORDER — CHLORHEXIDINE GLUCONATE 0.12 % MT SOLN: 15.0000 mL | Freq: Two times a day (BID) | OROMUCOSAL | Status: DC

## 2015-01-05 MED ORDER — POTASSIUM CHLORIDE CRYS ER 20 MEQ PO TBCR
20.0000 meq | EXTENDED_RELEASE_TABLET | Freq: Two times a day (BID) | ORAL | Status: DC
  Administered 2015-01-05: 20 meq via ORAL
  Filled 2015-01-05: qty 1

## 2015-01-05 MED ORDER — GABAPENTIN 100 MG PO CAPS
100.0000 mg | ORAL_CAPSULE | Freq: Two times a day (BID) | ORAL | Status: DC
Start: 2015-01-05 — End: 2015-01-06

## 2015-01-05 MED ORDER — AMLODIPINE BESYLATE 5 MG PO TABS: 5.0000 mg | ORAL_TABLET | Freq: Every day | ORAL | Status: DC

## 2015-01-05 MED ORDER — FENTANYL CITRATE (PF) 100 MCG/2ML IJ SOLN: 50.0000 ug | INTRAMUSCULAR | Status: DC | PRN

## 2015-01-05 MED ORDER — PANTOPRAZOLE SODIUM 40 MG PO TBEC
40.0000 mg | DELAYED_RELEASE_TABLET | Freq: Every day | ORAL | Status: DC
  Administered 2015-01-05: 40 mg via ORAL
  Filled 2015-01-05: qty 1

## 2015-01-05 MED ORDER — ASPIRIN 81 MG PO CHEW
81.0000 mg | CHEWABLE_TABLET | Freq: Every day | ORAL | Status: DC
  Administered 2015-01-05: 81 mg via ORAL
  Filled 2015-01-05: qty 1

## 2015-01-05 MED FILL — Medication: Qty: 1 | Status: AC

## 2015-01-06 ENCOUNTER — Ambulatory Visit: Payer: Medicare Other | Admitting: Internal Medicine

## 2015-01-06 MED ORDER — LORAZEPAM 2 MG/ML IJ SOLN: 0.5000 mg | Freq: Once | INTRAMUSCULAR | Status: DC

## 2015-01-09 NOTE — Discharge Summary (Signed)
Death Summary  Judith Shepherd ZOX:[id number] DOB: Oct 05, 1945 DOA: 01/20/15  PCP: Rene Paci, MD PCP/Office notified:   Admit date: 01/20/2015 Date of Death: 01/21/2015 at 22:19    Brief narrative:    69 y.o. female with HTN, HLD, DM (insulin dependent), chronic diastolic CHF, presented to [facility] ED for evaluation of recurrent falls. In ED, noted to have SBP in 200's and pulmonary vascular congestion on CXR. TRH asked to admit to SDU for further evaluation. Cardiology has been consulted.  Hospital course complicated by an episode of large vomitus and pt subsequently found in PEA, requiring ACLS protocol to be initiated. Pt was noted to also have an episode of v-fib and was defibrillated twice, also given amiodarone. Note was made that pt was difficult to intubate due to copious amount of emesis present and airway was difficult to visualize. Second physician required to assist with intubation and was also unsuccessful, finally on third attempt pt intubated. Approximately 25 minutes went by from the time when the code was called to a return of a pulse. Second cardiac arrest/PEA occurred shortly after and pt also noted to be bradycardic, eventually in v-fib and has not survived second resuscitation. Patient passed away at 22:19. Family notified by nursing staff..    Assessment/Plan:    Principal Problem:   Acute hypoxic respiratory failure secondary to acute cardiac arrest, PEA, v-fib, VDRF - in the setting of large amount of vomiting and subsequent aspiration  - pt quickly desaturated post vomiting and went into PEA requiring intubation  - please see above description of the events that took place    Hypoxic encephalopathy - recorded 23 minutes in first ACLS protocol before return of spont circulation - pt was intubated and has not been able to survive this event   Acute respiratory failure secondary to acute on chronic diastolic CHF - precipitated by accelerated HTN - pt wason  Norvasc 5 mg PO QD, Coreg 6.25 mg PO BID, Clonidine 0.1 mg PO BID, Lasix 60 mg IV BID - cardiology was following and pt was initially responding well to diuresis    Elevated troponins - likely demand ischemia in the setting of the accelerated HTN and acute on chronic diastolic CHF - no ischemic work up was recommended by cardiology team and plan was to focus on diuresis   Active Problems:  Acute on chronic kidney disease stage III - with baseline Cr ~ 1.7 in the past year - Cr was up from baseline in the setting acute on chronic diastolic CHF    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, neuropathies and retinopathy  - last A1C in May 2016 was 11.9 but was as high as 15 in the past and thus suggesting component of non compliance   Anemia of chronic disease, CKD - no signs of active bleeding noted while inpatient    Accelerated HTN - management as noted above    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  IV access:  Peripheral IV  Procedures and diagnostic studies:   Dg Chest 2 View 2015/01/20 Cardiac enlargement. 2. Pulmonary vascular congestion.   Medical Consultants:  Cardiology  SignedDebbora Presto:  MAGICK-Montavis Schubring  [facility] 01/09/2015, 9:24 PM [phone]

## 2015-01-30 NOTE — Progress Notes (Addendum)
eLink Physician-Brief Progress Note Patient Name: Loleta DickerMattie C Corredor DOB: 12/26/1945 MRN: [id number]   Date of Service  01/20/2015  HPI/Events of Note  Patient with another CODE BLUE, this occurrence with bradycardia and loss of pulse, given atropine and ACLS protocol started, noted to have vfib, shocked again x 2, no ROSC at 23min, ACLS stopped.   eICU Interventions  Time of death 2219     Intervention Category Major Interventions: Other:  Barre Aydelott 01/03/2015, 10:29 PM

## 2015-01-30 NOTE — Progress Notes (Addendum)
Inpatient Diabetes Program Recommendations  AACE/ADA: New Consensus Statement on Inpatient Glycemic Control (2013)  Target Ranges:  Prepandial:   less than 140 mg/dL      Peak postprandial:   less than 180 mg/dL (1-2 hours)      Critically ill patients:  140 - 180 mg/dL    Results for Loleta DickerTERSON, Myrtha C (MRN [id number]) as of 01/03/2015 09:27  Ref. Range 11/09/2014 12:12  Hemoglobin A1C Latest Ref Range: 4.6-6.5 % 11.9 (H)    Results for Loleta DickerTERSON, Demetrius C (MRN [id number]) as of 01/08/2015 09:27  Ref. Range 12/31/2014 14:50 01/03/2015 19:13  Glucose-Capillary Latest Ref Range: 65-99 mg/dL 782279 (H) 956208 (H)    Admit with: HTN/ CHF  History: DM, HTN, CAD, CHF, Blindness  Home DM Meds: Levemir 25 units bid  Current DM Orders: Levemir 25 units bid     -[facility] pt [facility]  No adjustments were made to her insulin regimen.  -Of note, per PCP notes, patient is noncompliant with her medications at home.  Has been counseled about the importance of taking medications as prescribed by her PCP, Dr. Rene PaciValerie Leschber.  -Patient lives alone per notes.  May need assistance at home or may need SNF at time of d/c?    MD- Please start Novolog Moderate SSI (0-15 units) TID AC + HS to current hospital regimen     Addendum 11AM: Attempted to speak with pt about her home insulin regimen.  Pt was able to tell me that she uses the insulin pen at home and listens for "26 clicks".  Cannot see the lines on an insulin syringe so must use insulin pens.  Per pt, her son lives with her but she gives her insulin herself.  When asked if she ever misses doses, patient stated "sometimes".  I asked patient how often she misses doses but patient seemed confused by my question and did not give me a clear answer.  Not sure how compliant patient is being with her insulin dosing.    I also asked patient if she had a CBG meter at home and asked her how often she checks her CBGs.  Patient  stated "I listen for the 26 clicks".  I stated to patient that I was inquiring about her CBG meter and how often she checks.  Patient again stated "26 clicks".  Not sure if patient has a CBG meter and not sure she is even checking at home.  I am unsure how well patient is able to take care of herself.  Patient stated she can see shapes but I don't know if she can perform ADLs or cook for herself.    Will follow Ambrose FinlandJeannine Johnston Minyon Billiter RN, MSN, CDE Diabetes Coordinator Inpatient Glycemic Control Team Team Pager: [phone] (8a-5p)

## 2015-01-30 NOTE — Progress Notes (Signed)
  Echocardiogram 2D Echocardiogram has been performed.  Judith Shepherd, Judith Shepherd 01/04/2015, 11:55 AM

## 2015-01-30 NOTE — Progress Notes (Signed)
Pt was a difficult intubation and there were several attempts made before pt was successfully intubated with 7.5 ETT at 21 at the lip by Dr. Freida BusmanAllen. Bilateral breath sounds were heard upon auscultation and tube placement was confirmed with postive color change via ETCO2 detector. Post intubation ETCO2 detector was connected to zoll however, due to large amounts of vomit during CPR ETCO2 was unable to pick up.

## 2015-01-30 NOTE — Consult Note (Signed)
Reason for Consult:   CHF, elevated Troponin  Requesting Physician: [facility] Cardi[facility] Dr Shirlee LatchMcLean  HPI: This is a 69 y.o.morbidly obese, AA, female who lives in her home with her son. She has a past medical history significant for HTN, HCVD,chronic diastolic CHF, DM with retinopathy (blind), nephropathy,and neuropathy, and small vessel CAD in 2010. She last saw cardiology in 2014. 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 Her echo in March 2014 showed an EF of 60% with moderately large pericardial effusion with no tamponade, no change from 2013.   She is admitted now after a fall at home. She was found to have accelerated HTN,acute on chronic CHF, uncontrolled DM, and eleavted Troponin. She is a poor historian. She does answer question appropriately but is slow to respond. She was placed on IV NTG and given Hydralazine for HTN and IV Lasix for CHF. She is incontinent of urine so there is no I/O.   PMHx:  Past Medical History  Diagnosis Date  . 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 CAD 10/2008 cath    Adenosine myoview (5/10): EF 38%, mild anterior ischemia. LHC/RHC (5/10): mean RA 3, mean PCWP 5, EF 65%. Diffuse CAD in small branch vessels, consistent with diabetes. No significant stenoses in the major vessels.   . Chronic diastolic heart failure     Probably diastolic. EF 65% on echo (myoview read as 38% but may be inaccurate). Echo (5/10) with mod-severe LVH, EF 65%, diastolic dysfunction with evidence for increased LV filling pressure, no significant AS, mild LAE.  Echo 4/13:  Severe LVH, EF 60-65%, grade 2 diast dysfxn, mild LAE, mod pericardial eff with mild R atrial chamber collapse, myocardium with speckled appearance; consider amyloid.  . Arthritis     R>L knee, declines TKR  . ANEMIA   . Diabetes mellitus, type 2     uncontrolled, med noncompliance  . DYSLIPIDEMIA   . HYPERTENSION   . CARPAL  TUNNEL SYNDROME, BILATERAL   . Depression with anxiety 11/09/2014    Past Surgical History  Procedure Laterality Date  . Abdominal hysterectomy      SOCHx:  reports that she has never smoked. She has never used smokeless tobacco. She reports that she does not drink alcohol or use illicit drugs.  FAMHx: Family History  Problem Relation Age of Onset  . Heart disease Mother   . Diabetes Mother   . Diabetes Sister   . Cancer Sister   . Diabetes Sister     ALLERGIES: No Known Allergies  ROS: see H&P for complete ROS  HOME MEDICATIONS: Prior to Admission medications   Medication Sig Start Date End Date Taking? Authorizing Provider  aspirin 325 MG tablet Take 325 mg by mouth daily.   Yes Historical Provider, MD  atorvastatin (LIPITOR) 40 MG tablet Take 1 tablet (40 mg total) by mouth daily. 12/28/14  Yes Newt LukesValerie A Leschber, MD  carvedilol (COREG) 25 MG tablet Take 1 tablet (25 mg total) by mouth 2 (two) times daily with a meal. 10/21/14  Yes Newt LukesValerie A Leschber, MD  furosemide (LASIX) 40 MG tablet Take 1 and 1/2 tablets twice per day Patient taking differently: Take 40 mg by mouth daily. Take 1 and 1/2 tablets twice per day 11/09/14  Yes Corwin LevinsJames W John, MD  insulin detemir (LEVEMIR) 100 UNIT/ML injection Inject 0.25 mLs (25 Units total) into the skin 2 (two) times daily. 10/21/14 10/21/15 Yes Vikki PortsValerie  A Leschber, MD  Insulin Pen Needle 31G X 5 MM MISC 1 each by Does not apply route daily. 05/12/13  Yes Newt Lukes, MD  lisinopril (PRINIVIL,ZESTRIL) 20 MG tablet Take 1 tablet (20 mg total) by mouth daily. 10/21/14  Yes Newt Lukes, MD  amLODipine (NORVASC) 10 MG tablet Take 1 tablet (10 mg total) by mouth daily. Patient not taking: Reported on 01/01/2015 02/01/14   Newt Lukes, MD  escitalopram (LEXAPRO) 10 MG tablet Take 1 tablet (10 mg total) by mouth daily. Patient not taking: Reported on 01/19/2015 11/09/14 02/07/15  Corwin Levins, MD  ferrous sulfate 325 (65 FE) MG tablet Take  1 tablet (325 mg total) by mouth 3 (three) times daily with meals. Patient not taking: Reported on 01/26/2015 10/12/11 01/23/2015  Vassie Loll, MD  fluticasone [facility]) 50 MCG/ACT nasal spray Place 2 sprays into both nostrils daily. Patient not taking: Reported on 06/04/2014 02/01/14   Newt Lukes, MD  gabapentin (NEURONTIN) 300 MG capsule Take 1 capsule (300 mg total) by mouth at bedtime. Patient not taking: Reported on 01/09/2015 02/01/14 08/24/15  Newt Lukes, MD  potassium chloride SA (K-DUR,KLOR-CON) 20 MEQ tablet Take 1 tablet (20 mEq total) by mouth 2 (two) times daily. Patient not taking: Reported on 01/03/2015 02/01/14   Newt Lukes, MD    HOSPITAL MEDICATIONS: I have reviewed the patient's current medications.  VITALS: Blood pressure 156/45, pulse 50, temperature 97.5 F (36.4 C), temperature source Oral, resp. rate 19, height  (1.676 m), weight 226 lb 13.7 oz (102.9 kg), SpO2 95 %.  PHYSICAL EXAM: General appearance: cooperative, no distress, morbidly obese, slowed mentation and mildly diaphoretic Neck: no carotid bruit and no JVD Lungs: decreased breath sounds (poor effort) Heart: regular rate and rhythm Abdomen: obese, not distended Extremities: trace edema Pulses: diminnished Skin: cool and clammy Neurologic: Responds but is slow  LABS: Results for orders placed or performed during the hospital encounter of 12/30/2014 (from the past 24 hour(s))  CBG monitoring, ED     Status: Abnormal   Collection Time: 01/23/2015  2:50 PM  Result Value Ref Range   Glucose-Capillary 279 (H) 65 - 99 mg/dL   Comment 1 Notify RN    Comment 2 Document in Chart   Basic metabolic panel     Status: Abnormal   Collection Time: 01/17/2015  4:31 PM  Result Value Ref Range   Sodium 140 135 - 145 mmol/L   Potassium 4.4 3.5 - 5.1 mmol/L   Chloride 104 101 - 111 mmol/L   CO2 28 22 - 32 mmol/L   Glucose, Bld 288 (H) 65 - 99 mg/dL   BUN 33 (H) 6 - 20 mg/dL   Creatinine, Ser 8.11 (H)  0.44 - 1.00 mg/dL   Calcium 9.1 8.9 - 91.4 mg/dL   GFR calc non Af Amer 26 (L) >60 mL/min   GFR calc Af Amer 30 (L) >60 mL/min   Anion gap 8 5 - 15  Troponin I     Status: Abnormal   Collection Time: 12/30/2014  4:31 PM  Result Value Ref Range   Troponin I 0.60 (HH) <0.031 ng/mL  CBC with Differential     Status: Abnormal   Collection Time: 01/19/2015  4:31 PM  Result Value Ref Range   WBC 9.5 4.0 - 10.5 K/uL   RBC 4.02 3.87 - 5.11 MIL/uL   Hemoglobin 10.0 (L) 12.0 - 15.0 g/dL   HCT 78.2 (L) 95.6 - 21.3 %  MCV 83.3 78.0 - 100.0 fL   MCH 24.9 (L) 26.0 - 34.0 pg   MCHC 29.9 (L) 30.0 - 36.0 g/dL   RDW 16.1 09.6 - 04.5 %   Platelets 283 150 - 400 K/uL   Neutrophils Relative % 86 (H) 43 - 77 %   Neutro Abs 8.1 (H) 1.7 - 7.7 K/uL   Lymphocytes Relative 8 (L) 12 - 46 %   Lymphs Abs 0.8 0.7 - 4.0 K/uL   Monocytes Relative 5 3 - 12 %   Monocytes Absolute 0.5 0.1 - 1.0 K/uL   Eosinophils Relative 1 0 - 5 %   Eosinophils Absolute 0.1 0.0 - 0.7 K/uL   Basophils Relative 0 0 - 1 %   Basophils Absolute 0.0 0.0 - 0.1 K/uL  Brain natriuretic peptide     Status: Abnormal   Collection Time: 2015/01/21  4:31 PM  Result Value Ref Range   B Natriuretic Peptide 2568.0 (H) 0.0 - 100.0 pg/mL  Urinalysis, Routine w reflex microscopic (not at [facility])     Status: Abnormal   Collection Time: January 21, 2015  4:37 PM  Result Value Ref Range   Color, Urine YELLOW YELLOW   APPearance CLEAR CLEAR   Specific Gravity, Urine 1.010 1.005 - 1.030   pH 7.0 5.0 - 8.0   Glucose, UA 500 (A) NEGATIVE mg/dL   Hgb urine dipstick SMALL (A) NEGATIVE   Bilirubin Urine NEGATIVE NEGATIVE   Ketones, ur NEGATIVE NEGATIVE mg/dL   Protein, ur 409 (A) NEGATIVE mg/dL   Urobilinogen, UA 0.2 0.0 - 1.0 mg/dL   Nitrite NEGATIVE NEGATIVE   Leukocytes, UA NEGATIVE NEGATIVE  Urine microscopic-add on     Status: None   Collection Time: 2015-01-21  4:37 PM  Result Value Ref Range   RBC / HPF 0-2 <3 RBC/hpf  CBG monitoring, ED     Status:  Abnormal   Collection Time: 01/21/15  7:13 PM  Result Value Ref Range   Glucose-Capillary 208 (H) 65 - 99 mg/dL  MRSA PCR Screening     Status: None   Collection Time: Jan 21, 2015  7:45 PM  Result Value Ref Range   MRSA by PCR NEGATIVE NEGATIVE  Troponin I (q 6hr x 3)     Status: Abnormal   Collection Time: 01/21/15  8:42 PM  Result Value Ref Range   Troponin I 0.53 (HH) <0.031 ng/mL  Comprehensive metabolic panel     Status: Abnormal   Collection Time: 01/18/2015  2:10 AM  Result Value Ref Range   Sodium 140 135 - 145 mmol/L   Potassium 3.5 3.5 - 5.1 mmol/L   Chloride 106 101 - 111 mmol/L   CO2 31 22 - 32 mmol/L   Glucose, Bld 115 (H) 65 - 99 mg/dL   BUN 33 (H) 6 - 20 mg/dL   Creatinine, Ser 8.11 (H) 0.44 - 1.00 mg/dL   Calcium 8.3 (L) 8.9 - 10.3 mg/dL   Total Protein 6.2 (L) 6.5 - 8.1 g/dL   Albumin 2.6 (L) 3.5 - 5.0 g/dL   AST 17 15 - 41 U/L   ALT 18 14 - 54 U/L   Alkaline Phosphatase 63 38 - 126 U/L   Total Bilirubin 0.5 0.3 - 1.2 mg/dL   GFR calc non Af Amer 25 (L) >60 mL/min   GFR calc Af Amer 29 (L) >60 mL/min   Anion gap 3 (L) 5 - 15  CBC     Status: Abnormal   Collection Time: 01/10/2015  2:10 AM  Result Value Ref Range   WBC 6.9 4.0 - 10.5 K/uL   RBC 3.29 (L) 3.87 - 5.11 MIL/uL   Hemoglobin 8.4 (L) 12.0 - 15.0 g/dL   HCT 16.1 (L) 09.6 - 04.5 %   MCV 83.6 78.0 - 100.0 fL   MCH 25.5 (L) 26.0 - 34.0 pg   MCHC 30.5 30.0 - 36.0 g/dL   RDW 40.9 (H) 81.1 - 91.4 %   Platelets 231 150 - 400 K/uL  Troponin I (q 6hr x 3)     Status: Abnormal   Collection Time: 01/26/2015  2:10 AM  Result Value Ref Range   Troponin I 0.47 (H) <0.031 ng/mL  Troponin I (q 6hr x 3)     Status: Abnormal   Collection Time: 01/19/2015  7:46 AM  Result Value Ref Range   Troponin I 0.48 (H) <0.031 ng/mL    EKG: NSR, SB with lateral TWI (similar to 2013 EKG)  IMAGING: Dg Chest 2 View  01-20-2015   CLINICAL DATA:  Hyperglycemia.  Weakness and shortness of breath.  EXAM: CHEST  2 VIEW  COMPARISON:   11/09/2014  FINDINGS: Marked cardiac enlargement and pulmonary vascular congestion is noted. No pleural effusion or edema. No airspace consolidation.  IMPRESSION: 1. Cardiac enlargement. 2. Pulmonary vascular congestion.   Electronically Signed   By: Signa Kell M.D.   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 B/P currently 170-180 systolic, probably OK for now.   Suspect Troponin (trending down) may be from HTN and acute CHF as opposed to ACS.  Concerned about her mental status- ? HTN encephalopathy, (TSH was WNL May 2016) will monitor.   Anemia concerning, will decrease ASA to 81 mg, add PPI, check stools.   Will back off on Coreg with CHF and bradycardia, replace K+, check Mg++. Check echo.  MD to see.   Time Spent Directly with Patient: 45 minutes  Abelino Derrick [phone] beeper 01/01/2015, 8:32 AM

## 2015-01-30 NOTE — Progress Notes (Signed)
Patient ID: Judith Shepherd, female   DOB: August 24, 1945, 69 y.o.   MRN: [id number]  [facility] PROGRESS NOTE  DURGA SALDARRIAGA XBM:[id number] DOB: 08/25/1945 DOA: 01/20/2015 PCP: Rene Paci, MD  Brief narrative:    69 y.o. female with HTN, HLD, DM (insulin dependent), chronic diastolic CHF, presented to [facility] ED for evaluation of recurrent falls. In ED, noted to have SBP in 200's and pulmonary vascular congestion on CXR. TRH asked to admit to SDU for further evaluation. Cardiology has been consulted.  Assessment/Plan:    Principal Problem:   Acute respiratory failure secondary to acute on chronic diastolic CHF - precipitated by accelerated HTN - pt currently on Norvasc 5 mg PO QD, Coreg 6.25 mg PO BID, Clonidine 0.1 mg PO BID, Lasix 60 mg IV BID - also providing Hydralazine IV as needed for SBP > 155 - appreciate cardiology assistance, will continue to follow up on recommendations - weight today 102.9 kg, will monitor daily weights, strict I/O - please note that acute renal failure precludes use of ACEI/ARB's     Elevated troponins - likely demand ischemia in the setting of the accelerated HTN and acute on chronic diastolic CHF - no ischemic work up recommended at this time by cardiology team - focus on diuresis and BP control   Active Problems:   Acute on chronic kidney disease stage III - with baseline Cr ~ 1.7 in the past year - Cr is now slightly up from baseline and may certainly represent new baseline - continue to monitor while being diuresed     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, neuropathies and retinopathy  - last A1C in May 2016 was 11.9 but was as high as 15 in the past and thus suggesting component of non compliance - appreciate diabetic educators assistance  - will continue Insulin Levemir and will add SSI moderate coverage with night time coverage     Anemia of chronic disease, CKD - no signs of active bleeding - continue to monitor      Accelerated HTN - management as noted above     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  DVT prophylaxis - SCD's  Code Status: Full.  Family Communication:  plan of care discussed with the patient Disposition Plan: Home when stable.   IV access:  Peripheral IV  Procedures and diagnostic studies:    Dg Chest 2 View 01/28/2015  Cardiac enlargement. 2. Pulmonary vascular congestion.    Medical Consultants:  Cardiology  Other Consultants:  PT  IAnti-Infectives:   None  Debbora Presto, MD  [facility] Pager [phone]  If 7PM-7AM, please contact night-coverage [email or web address] Password [email or web address] 01-13-15, 3:22 PM   LOS: 1 day   HPI/Subjective: No events overnight.   Objective: Filed Vitals:   01-13-2015 0610 01-13-2015 0620 13-Jan-2015 0814 01-13-2015 1100  BP: 175/56 156/45    Pulse: 52 50    Temp:   97.5 F (36.4 C) 97.4 F (36.3 C)  TempSrc:   Oral Oral  Resp: 17 19    Height:      Weight:      SpO2: 95% 95%      Intake/Output Summary (Last 24 hours) at 01/13/2015 1522  Last data filed at 01/28/2015 1412  Gross per 24 hour  Intake 740.31 ml  Output   1025 ml  Net -284.69 ml    Exam:   General:  Pt is somnolent but easy to awake, follows most of the commands but very slow mentation   Cardiovascular: Regular rate and rhythm, S1/S2, no rubs, no gallops  Respiratory: Diminished breath sounds bilaterally with poor inspiratory effort   Abdomen: Soft, non tender, non distended, bowel sounds present, no guarding  Extremities: Trace bilateral LE edema, pulses DP and PT palpable bilaterally  Neuro: Grossly nonfocal  Data Reviewed: Basic Metabolic Panel:  Recent Labs Lab 12/22/14 1631 01/06/2015 0210 01/22/2015 0746  NA 140 140  --    K 4.4 3.5  --   CL 104 106  --   CO2 28 31  --   GLUCOSE 288* 115*  --   BUN 33* 33*  --   CREATININE 1.92* 1.94*  --   CALCIUM 9.1 8.3*  --   MG  --   --  2.1   Liver Function Tests:  Recent Labs Lab 01/09/2015 0210  AST 17  ALT 18  ALKPHOS 63  BILITOT 0.5  PROT 6.2*  ALBUMIN 2.6*   CBC:  Recent Labs Lab 12/22/14 1631 01/01/2015 0210  WBC 9.5 6.9  NEUTROABS 8.1*  --   HGB 10.0* 8.4*  HCT 33.5* 27.5*  MCV 83.3 83.6  PLT 283 231   Cardiac Enzymes:  Recent Labs Lab 12/22/14 1631 12/22/14 2042 01/15/2015 0210 01/21/2015 0746  TROPONINI 0.60* 0.53* 0.47* 0.48*   BNP: Invalid input(s): POCBNP CBG:  Recent Labs Lab 12/22/14 1450 12/22/14 1913  GLUCAP 279* 208*    Recent Results (from the past 240 hour(s))  MRSA PCR Screening     Status: None   Collection Time: 12/22/14  7:45 PM  Result Value Ref Range Status   MRSA by PCR NEGATIVE NEGATIVE Final    Comment:        The GeneXpert MRSA Assay (FDA approved for NASAL specimens only), is one component of a comprehensive MRSA colonization surveillance program. It is not intended to diagnose MRSA infection nor to guide or monitor treatment for MRSA infections.      Scheduled Meds: . [START ON 01/06/2015] amLODipine  5 mg Oral Daily  . antiseptic oral rinse  7 mL Mouth Rinse BID  . aspirin  81 mg Oral Daily  . atorvastatin  40 mg Oral Daily  . carvedilol  6.25 mg Oral BID WC  . cloNIDine  0.1 mg Oral BID  . furosemide  60 mg Intravenous BID  . hydrALAZINE  10 mg Intravenous Once  . insulin detemir  25 Units Subcutaneous BID  . pantoprazole  40 mg Oral Daily  . potassium chloride  20 mEq Oral BID  . sodium chloride  3 mL Intravenous Q12H   Continuous Infusions: . nitroGLYCERIN 195 mcg/min (01/01/2015 1415)

## 2015-01-30 NOTE — Progress Notes (Signed)
PCCM PROGRESS NOTE  Was called by Judith CornELINK MD to evaluate patient during cardiac arrest. Patient had one episode of PEA arrest for 25 prior to my arrival. This was reportedly incited by an aspiration event, with rapid desaturation, and eventual loss of pulses. ROSC was achieved, however upon my arrival she was again in cardiac arrest. Initially this was PEA, which eventually converted to VF and VT. She received several rounds of CPR, defibrillation, epinephrine, amiodarone. Cardiac arrest was refractory to this and unfortunately she did not survive this event. 2nd code in all lasted about an additional 25 mins. See code sheet for more detailed se scription of events.    Judith Shepherd, AGACNP-BC [facility] Pager [phone] or [phone]  01/26/2015 10:26 PM

## 2015-01-30 NOTE — ED Provider Notes (Signed)
Called to the ICU due to patient cardiac arrest. Patient recently admitted for hypertensive crisis. Patient had emesis and had aspirated and when the nurses arrived patient was in [place]. Patient was bagged and acls protocol started including epinephrine. Patient did not respond to these interventions. She did have an episode where she went to ventricular fibrillation and was defibrillator twice as well as given amiodarone. Attempted to intubate the patient once and I cannot visualize the cords therefore I did not attempt to pass the ET tube. Patient was bagged and copious amounts of emesis was noted patient had her left side. At this point anesthesia assistance was requested and the CRNA arrived and attempted to intubate the patient and was also unsuccessful. On my repeat attempt I was able to pass a 7.5 ET tube through the cords. Placement confirmed with end-tidal CO2 detector as well as good waveform on the monitor. ACLS protocol was continued and patient did regain a pulse and blood pressure. The code was monitored by the critical care physician via the camera. Approximately 25 minutes went by from the time when the code was called to a return of a pulse. Care was turned over to the ICU physician.  Lorre NickAnthony Yahir Tavano, MD 01/25/2015 2159

## 2015-01-30 NOTE — Anesthesia Procedure Notes (Signed)
Procedure Name: Intubation Date/Time: 01/28/2015 9:38 PM Performed by: Early OsmondEARGLE, Latitia Housewright E Pre-anesthesia Checklist: Patient identified, Emergency Drugs available, Suction available and Patient being monitored Patient Re-evaluated:Patient Re-evaluated prior to inductionOxygen Delivery Method: Ambu bag Preoxygenation: Pre-oxygenation with 100% oxygen Intubation Type: Cricoid Pressure applied Ventilation: Mask ventilation without difficulty Laryngoscope Size: Mac and 4 Grade View: Grade III Tube type: Subglottic suction tube Tube size: 7.5 mm Number of attempts: 5 or more (Intubated by ED physician with cricoid pressure after attempts w/glidescope by MD and CRNA) Airway Equipment and Method: Stylet Placement Confirmation: ETT inserted through vocal cords under direct vision,  CO2 detector and breath sounds checked- equal and bilateral Secured at: 21 cm Dental Injury: Teeth and Oropharynx as per pre-operative assessment  Difficulty Due To: Difficult Airway- due to anterior larynx, Difficult Airway-  due to edematous airway and Difficult Airway- due to large tongue Comments: Called to code, CPR in progress. Patient aspirated, airway suctioned multiple times. Edema noted with Glidescope, Successfully intubated by ED physician

## 2015-01-30 NOTE — Progress Notes (Signed)
eLink Physician-Brief Progress Note Patient Name: Judith DickerMattie C Shepherd DOB: 09/01/1945 MRN: [id number]   Date of Service  01/09/2015  HPI/Events of Note  69 yo female admitted for fall, hypergylcemia, HTN urgency, found the floor with vomitus over her body, unresponsive with no pulse, CODE BLUE initiated.  ACLS performed for roughly 25mins, PEA and Vfib\vtach noted, shock x2, then  ROSC   eICU Interventions  STAT Head CT CVL placement Plan for hypothermia protocol pending ct head results EKG Code labs     Intervention Category Evaluation Type: New Patient Evaluation  Sande Pickert 01/22/2015, 9:43 PM

## 2015-01-30 NOTE — Progress Notes (Signed)
RN entered patient room and found the patient had emesis on her, her oxygen saturation was going down, and she was unresponsive. RN called a code blue. ACLS was unsuccessful; patient expired at 2219.

## 2015-01-30 NOTE — Progress Notes (Signed)
PT Cancellation Note  Patient Details Name: Loleta DickerMattie C Wilemon MRN: [id number] DOB: 10/03/1945   Cancelled Treatment:    Reason Eval/Treat Not Completed: Medical issues which prohibited therapy. Not yet ready per RN. Will check back another day. Thanks.    Rebeca AlertJannie Elmarie Devlin, MPT Pager: [phone]

## 2015-01-30 DEATH — deceased
